# Patient Record
Sex: Male | Born: 1980 | Race: White | Hispanic: No | Smoking: Current every day smoker
Health system: Southern US, Community
[De-identification: ages and names within clinical notes are randomized; demographics above are authoritative.]

## PROBLEM LIST (undated history)

## (undated) ENCOUNTER — Emergency Department (HOSPITAL_COMMUNITY): Discharge status: Against Medical Advice | Payer: Self-pay

## (undated) DIAGNOSIS — M549 Dorsalgia, unspecified: Secondary | ICD-10-CM

## (undated) DIAGNOSIS — F1910 Other psychoactive substance abuse, uncomplicated: Secondary | ICD-10-CM

## (undated) DIAGNOSIS — R51 Headache: Secondary | ICD-10-CM

## (undated) DIAGNOSIS — K219 Gastro-esophageal reflux disease without esophagitis: Secondary | ICD-10-CM

## (undated) HISTORY — PX: BACK SURGERY: SHX140

## (undated) HISTORY — PX: KNEE ARTHROSCOPY: SUR90

---
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
## 2003-06-01 ENCOUNTER — Ambulatory Visit (HOSPITAL_COMMUNITY): Admission: RE | Admit: 2003-06-01 | Discharge: 2003-06-01 | Payer: Self-pay | Admitting: Internal Medicine

## 2003-12-01 ENCOUNTER — Emergency Department (HOSPITAL_COMMUNITY): Admission: EM | Admit: 2003-12-01 | Discharge: 2003-12-01 | Payer: Self-pay | Admitting: Emergency Medicine

## 2005-02-06 ENCOUNTER — Ambulatory Visit (HOSPITAL_COMMUNITY): Admission: RE | Admit: 2005-02-06 | Discharge: 2005-02-06 | Payer: Self-pay | Admitting: Internal Medicine

## 2005-04-20 ENCOUNTER — Ambulatory Visit (HOSPITAL_COMMUNITY): Admission: RE | Admit: 2005-04-20 | Discharge: 2005-04-20 | Payer: Self-pay | Admitting: Family Medicine

## 2005-07-02 ENCOUNTER — Emergency Department (HOSPITAL_COMMUNITY): Admission: EM | Admit: 2005-07-02 | Discharge: 2005-07-03 | Payer: Self-pay | Admitting: Emergency Medicine

## 2005-12-25 ENCOUNTER — Emergency Department (HOSPITAL_COMMUNITY): Admission: EM | Admit: 2005-12-25 | Discharge: 2005-12-25 | Payer: Self-pay | Admitting: Emergency Medicine

## 2006-05-03 ENCOUNTER — Emergency Department (HOSPITAL_COMMUNITY): Admission: EM | Admit: 2006-05-03 | Discharge: 2006-05-03 | Payer: Self-pay | Admitting: Emergency Medicine
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
## 2007-03-04 ENCOUNTER — Emergency Department (HOSPITAL_COMMUNITY): Admission: EM | Admit: 2007-03-04 | Discharge: 2007-03-04 | Payer: Self-pay | Admitting: Emergency Medicine

## 2007-08-05 ENCOUNTER — Emergency Department (HOSPITAL_COMMUNITY): Admission: EM | Admit: 2007-08-05 | Discharge: 2007-08-05 | Payer: Self-pay | Admitting: Emergency Medicine

## 2007-11-17 ENCOUNTER — Emergency Department (HOSPITAL_COMMUNITY): Admission: EM | Admit: 2007-11-17 | Discharge: 2007-11-17 | Payer: Self-pay | Admitting: Emergency Medicine

## 2008-01-19 ENCOUNTER — Emergency Department (HOSPITAL_COMMUNITY): Admission: EM | Admit: 2008-01-19 | Discharge: 2008-01-19 | Payer: Self-pay | Admitting: Emergency Medicine

## 2009-07-10 ENCOUNTER — Emergency Department (HOSPITAL_COMMUNITY): Admission: EM | Admit: 2009-07-10 | Discharge: 2009-07-10 | Payer: Self-pay | Admitting: Emergency Medicine

## 2009-07-24 ENCOUNTER — Emergency Department (HOSPITAL_COMMUNITY): Admission: EM | Admit: 2009-07-24 | Discharge: 2009-07-24 | Payer: Self-pay | Admitting: Emergency Medicine

## 2009-09-06 ENCOUNTER — Emergency Department (HOSPITAL_COMMUNITY)
Admission: EM | Admit: 2009-09-06 | Discharge: 2009-09-06 | Payer: Self-pay | Source: Home / Self Care | Admitting: Emergency Medicine

## 2009-10-14 ENCOUNTER — Emergency Department (HOSPITAL_COMMUNITY)
Admission: EM | Admit: 2009-10-14 | Discharge: 2009-10-14 | Payer: Self-pay | Source: Home / Self Care | Admitting: Emergency Medicine

## 2010-02-22 ENCOUNTER — Emergency Department (HOSPITAL_COMMUNITY)
Admission: EM | Admit: 2010-02-22 | Discharge: 2010-02-22 | Payer: Self-pay | Source: Home / Self Care | Admitting: Emergency Medicine

## 2010-03-05 ENCOUNTER — Emergency Department (HOSPITAL_COMMUNITY)
Admission: EM | Admit: 2010-03-05 | Discharge: 2010-03-05 | Payer: Self-pay | Source: Home / Self Care | Admitting: Emergency Medicine

## 2010-04-16 ENCOUNTER — Emergency Department (HOSPITAL_COMMUNITY)
Admission: EM | Admit: 2010-04-16 | Discharge: 2010-04-16 | Payer: Self-pay | Source: Home / Self Care | Admitting: Emergency Medicine

## 2010-04-26 ENCOUNTER — Emergency Department (HOSPITAL_COMMUNITY)
Admission: EM | Admit: 2010-04-26 | Discharge: 2010-04-26 | Payer: Self-pay | Source: Home / Self Care | Admitting: Emergency Medicine

## 2010-06-05 ENCOUNTER — Encounter: Payer: Self-pay | Admitting: Internal Medicine

## 2010-06-07 ENCOUNTER — Emergency Department (HOSPITAL_COMMUNITY)
Admission: EM | Admit: 2010-06-07 | Discharge: 2010-06-07 | Payer: Self-pay | Source: Home / Self Care | Admitting: Emergency Medicine

## 2010-07-10 ENCOUNTER — Emergency Department (HOSPITAL_COMMUNITY)
Admission: EM | Admit: 2010-07-10 | Discharge: 2010-07-10 | Disposition: A | Discharge status: Home / Self Care | Payer: PRIVATE HEALTH INSURANCE | Attending: Emergency Medicine | Admitting: Emergency Medicine

## 2010-07-10 DIAGNOSIS — M545 Low back pain, unspecified: Secondary | ICD-10-CM | POA: Insufficient documentation

## 2010-07-22 ENCOUNTER — Emergency Department (HOSPITAL_COMMUNITY)
Admission: EM | Admit: 2010-07-22 | Discharge: 2010-07-22 | Disposition: A | Discharge status: Home / Self Care | Payer: PRIVATE HEALTH INSURANCE | Attending: Emergency Medicine | Admitting: Emergency Medicine

## 2010-07-22 ENCOUNTER — Emergency Department (HOSPITAL_COMMUNITY): Payer: PRIVATE HEALTH INSURANCE

## 2010-07-22 DIAGNOSIS — M545 Low back pain, unspecified: Secondary | ICD-10-CM | POA: Insufficient documentation

## 2010-07-22 DIAGNOSIS — F17200 Nicotine dependence, unspecified, uncomplicated: Secondary | ICD-10-CM | POA: Insufficient documentation

## 2010-07-22 DIAGNOSIS — S335XXA Sprain of ligaments of lumbar spine, initial encounter: Secondary | ICD-10-CM | POA: Insufficient documentation

## 2010-07-22 DIAGNOSIS — X503XXA Overexertion from repetitive movements, initial encounter: Secondary | ICD-10-CM | POA: Insufficient documentation

## 2010-07-25 LAB — CBC
HCT: 47.4 % (ref 39.0–52.0)
MCH: 32.2 pg (ref 26.0–34.0)
MCV: 87.8 fL (ref 78.0–100.0)
Platelets: 224 10*3/uL (ref 150–400)
RBC: 5.4 MIL/uL (ref 4.22–5.81)
RDW: 12.2 % (ref 11.5–15.5)
WBC: 19.2 10*3/uL — ABNORMAL HIGH (ref 4.0–10.5)

## 2010-07-25 LAB — DIFFERENTIAL
Basophils Absolute: 0 10*3/uL (ref 0.0–0.1)
Basophils Relative: 0 % (ref 0–1)
Lymphs Abs: 0.3 10*3/uL — ABNORMAL LOW (ref 0.7–4.0)
Monocytes Absolute: 0.8 10*3/uL (ref 0.1–1.0)
Neutrophils Relative %: 94 % — ABNORMAL HIGH (ref 43–77)

## 2010-07-25 LAB — HEPATIC FUNCTION PANEL
AST: 25 U/L (ref 0–37)
Indirect Bilirubin: 1.4 mg/dL — ABNORMAL HIGH (ref 0.3–0.9)
Total Bilirubin: 1.8 mg/dL — ABNORMAL HIGH (ref 0.3–1.2)
Total Protein: 7.7 g/dL (ref 6.0–8.3)

## 2010-07-25 LAB — LIPASE, BLOOD: Lipase: 25 U/L (ref 11–59)

## 2010-07-25 LAB — BASIC METABOLIC PANEL
Chloride: 105 mEq/L (ref 96–112)
GFR calc Af Amer: >60 mL/min (ref >60)
GFR calc non Af Amer: >60 mL/min (ref >60)

## 2010-08-14 ENCOUNTER — Emergency Department (HOSPITAL_COMMUNITY)
Admission: EM | Admit: 2010-08-14 | Discharge: 2010-08-14 | Disposition: A | Discharge status: Home / Self Care | Payer: PRIVATE HEALTH INSURANCE | Attending: Emergency Medicine | Admitting: Emergency Medicine

## 2010-08-14 DIAGNOSIS — M545 Low back pain, unspecified: Secondary | ICD-10-CM | POA: Insufficient documentation

## 2010-08-14 DIAGNOSIS — G8929 Other chronic pain: Secondary | ICD-10-CM | POA: Insufficient documentation

## 2010-08-21 ENCOUNTER — Emergency Department (HOSPITAL_COMMUNITY)
Admission: EM | Admit: 2010-08-21 | Discharge: 2010-08-21 | Disposition: A | Discharge status: Home / Self Care | Payer: PRIVATE HEALTH INSURANCE | Attending: Emergency Medicine | Admitting: Emergency Medicine

## 2010-08-21 DIAGNOSIS — F17200 Nicotine dependence, unspecified, uncomplicated: Secondary | ICD-10-CM | POA: Insufficient documentation

## 2010-08-21 DIAGNOSIS — X58XXXA Exposure to other specified factors, initial encounter: Secondary | ICD-10-CM | POA: Insufficient documentation

## 2010-08-21 DIAGNOSIS — M545 Low back pain, unspecified: Secondary | ICD-10-CM | POA: Insufficient documentation

## 2010-08-21 DIAGNOSIS — S335XXA Sprain of ligaments of lumbar spine, initial encounter: Secondary | ICD-10-CM | POA: Insufficient documentation

## 2010-08-26 ENCOUNTER — Emergency Department (HOSPITAL_COMMUNITY)
Admission: EM | Admit: 2010-08-26 | Discharge: 2010-08-26 | Disposition: A | Discharge status: Home / Self Care | Payer: Medicaid Other | Attending: Emergency Medicine | Admitting: Emergency Medicine

## 2010-08-26 DIAGNOSIS — F17200 Nicotine dependence, unspecified, uncomplicated: Secondary | ICD-10-CM | POA: Insufficient documentation

## 2010-08-26 DIAGNOSIS — F411 Generalized anxiety disorder: Secondary | ICD-10-CM | POA: Insufficient documentation

## 2010-08-26 DIAGNOSIS — M545 Low back pain, unspecified: Secondary | ICD-10-CM | POA: Insufficient documentation

## 2011-02-12 ENCOUNTER — Encounter: Payer: Self-pay | Admitting: *Deleted

## 2011-02-12 ENCOUNTER — Emergency Department (HOSPITAL_COMMUNITY)
Admission: EM | Admit: 2011-02-12 | Discharge: 2011-02-12 | Disposition: A | Discharge status: Home / Self Care | Payer: Medicaid Other | Attending: Emergency Medicine | Admitting: Emergency Medicine

## 2011-02-12 DIAGNOSIS — F17200 Nicotine dependence, unspecified, uncomplicated: Secondary | ICD-10-CM | POA: Insufficient documentation

## 2011-02-12 DIAGNOSIS — M5430 Sciatica, unspecified side: Secondary | ICD-10-CM | POA: Insufficient documentation

## 2011-02-12 HISTORY — DX: Dorsalgia, unspecified: M54.9

## 2011-02-12 MED ORDER — HYDROMORPHONE HCL 2 MG/ML IJ SOLN
2.0000 mg | Freq: Once | INTRAMUSCULAR | Status: AC
  Administered 2011-02-12: 2 mg via INTRAMUSCULAR
  Filled 2011-02-12: qty 1

## 2011-02-12 MED ORDER — ONDANSETRON HCL 4 MG/2ML IJ SOLN
4.0000 mg | Freq: Once | INTRAMUSCULAR | Status: AC
  Administered 2011-02-12: 4 mg via INTRAMUSCULAR
  Filled 2011-02-12: qty 2

## 2011-02-12 MED ORDER — OXYCODONE-ACETAMINOPHEN 5-325 MG PO TABS: 1.0000 | ORAL_TABLET | ORAL | Status: AC | PRN

## 2011-02-12 MED ORDER — DIAZEPAM 5 MG PO TABS: 5.0000 mg | ORAL_TABLET | Freq: Four times a day (QID) | ORAL | Status: AC

## 2011-02-12 NOTE — ED Notes (Signed)
Pt reports chronic pain in legs and arms d/t buldging disc, pt reports pain meds are not working

## 2011-02-25 NOTE — ED Provider Notes (Cosign Needed)
History     CSN: [id number] Arrival date & time: 02/12/2011  8:28 PM  Chief Complaint  Patient presents with  . Muscle Pain    (Consider location/radiation/quality/duration/timing/severity/associated sxs/prior treatment) Patient is a 30 y.o. male presenting with back pain. The history is provided by the patient and medical records.  Back Pain  This is a chronic problem. The current episode started more than 1 week ago. The problem occurs constantly. The problem has been gradually worsening. Associated with: He has chronic low back pain, for which he has been seen on a number of occasions in the [facility] ED. The pain is present in the lumbar spine. Quality: It is a sharp pain going to his legs. The pain is at a severity of 10/10. The pain is severe. The symptoms are aggravated by bending and twisting. He has tried analgesics and muscle relaxants for the symptoms. The treatment provided no relief.   Pt reports chronic pain in legs and arms d/t buldging disc, pt reports pain meds are not working  Past Medical History  Diagnosis Date  . Back pain     Past Surgical History  Procedure Date  . Knee arthroscopy     No family history on file.  History  Substance Use Topics  . Smoking status: Current Everyday Smoker  . Smokeless tobacco: Not on file  . Alcohol Use: No      Review of Systems  Musculoskeletal: Positive for back pain.  All other systems reviewed and are negative.    Allergies  Erythrocin; Penicillins; and Tramadol  Home Medications   Current Outpatient Rx  Name Route Sig Dispense Refill  . VALIUM PO Oral Take by mouth.      Marland Kitchen HYDROCODONE-ACETAMINOPHEN 5-325 MG PO TABS Oral Take 1 tablet by mouth every 6 (six) hours as needed.        BP 115/84  Pulse 69  Temp(Src) 98 F (36.7 C) (Oral)  Resp 18  Ht 6\' 5"  (1.956 m)  Wt 200 lb (90.719 kg)  BMI 23.72 kg/m2  SpO2 100%  Physical Exam  Constitutional: He is oriented to person, place, and time. He  appears well-developed and well-nourished.       In moderate distress with back pain.  HENT:  Head: Normocephalic and atraumatic.  Right Ear: External ear normal.  Left Ear: External ear normal.  Mouth/Throat: Oropharynx is clear and moist.  Eyes: Conjunctivae and EOM are normal. Pupils are equal, round, and reactive to light.  Neck: Normal range of motion. Neck supple.  Cardiovascular: Normal rate, regular rhythm and normal heart sounds.   Pulmonary/Chest: Effort normal and breath sounds normal.  Abdominal: Soft. Bowel sounds are normal. He exhibits no distension. There is no tenderness.  Musculoskeletal:       He localizes pain to the lumbar region and into the legs bilaterally.  There is no palpable deformity or point tenderness of the lumbar region.  Lymphadenopathy:    He has no cervical adenopathy.  Neurological: He is alert and oriented to person, place, and time.       No sensory or motor deficits.  Skin: Skin is warm and dry.  Psychiatric: He has a normal mood and affect. His behavior is normal.    ED Course  Procedures (including critical care time) Pt given injection of dilaudid and zofran for pain relief. Labs Reviewed - No data to display No results found. Pt has recurrent bouts of low back pain.  There was no history of injury  so no imaging was required.  He was prescribed Percocet and Valium for his pain and muscle spasm.  F/U with Dr. Jordan Likes, whom he has seen in the past for this problem.  1. Sciatica      MDM  i don't believe i saw this pt.  No midlevel provider was involved in this pt's management. Osvaldo Human, M.D.       Worthy Rancher, PA 02/28/11 1702  Carleene Cooper III, MD 03/05/11 [phone]

## 2011-03-10 ENCOUNTER — Emergency Department (HOSPITAL_COMMUNITY)
Admission: EM | Admit: 2011-03-10 | Discharge: 2011-03-10 | Disposition: A | Discharge status: Home / Self Care | Payer: Medicaid Other | Attending: Emergency Medicine | Admitting: Emergency Medicine

## 2011-03-10 ENCOUNTER — Encounter (HOSPITAL_COMMUNITY): Payer: Self-pay | Admitting: *Deleted

## 2011-03-10 DIAGNOSIS — G8929 Other chronic pain: Secondary | ICD-10-CM | POA: Insufficient documentation

## 2011-03-10 DIAGNOSIS — M549 Dorsalgia, unspecified: Secondary | ICD-10-CM | POA: Insufficient documentation

## 2011-03-10 MED ORDER — DIAZEPAM 5 MG PO TABS
5.0000 mg | ORAL_TABLET | Freq: Once | ORAL | Status: AC
  Administered 2011-03-10: 5 mg via ORAL

## 2011-03-10 MED ORDER — DIAZEPAM 5 MG PO TABS
ORAL_TABLET | ORAL | Status: AC
  Administered 2011-03-10: 5 mg via ORAL
  Filled 2011-03-10: qty 1

## 2011-03-10 MED ORDER — HYDROMORPHONE HCL 2 MG/ML IJ SOLN
2.0000 mg | Freq: Once | INTRAMUSCULAR | Status: AC
  Administered 2011-03-10: 13:00:00 via INTRAMUSCULAR

## 2011-03-10 MED ORDER — HYDROMORPHONE HCL 2 MG/ML IJ SOLN
INTRAMUSCULAR | Status: AC
  Filled 2011-03-10: qty 1

## 2011-03-10 NOTE — ED Notes (Signed)
Chronic back pain x 8 months.  States it never stops hurting and he needs some relief from the pain until he can see his neurologist until 03/17/11.

## 2011-03-15 ENCOUNTER — Encounter (HOSPITAL_COMMUNITY): Payer: Self-pay | Admitting: *Deleted

## 2011-03-15 ENCOUNTER — Emergency Department (HOSPITAL_COMMUNITY)
Admission: EM | Admit: 2011-03-15 | Discharge: 2011-03-15 | Disposition: A | Discharge status: Home / Self Care | Payer: Self-pay | Attending: Emergency Medicine | Admitting: Emergency Medicine

## 2011-03-15 DIAGNOSIS — G8929 Other chronic pain: Secondary | ICD-10-CM | POA: Insufficient documentation

## 2011-03-15 DIAGNOSIS — M545 Low back pain, unspecified: Secondary | ICD-10-CM | POA: Insufficient documentation

## 2011-03-15 DIAGNOSIS — F17200 Nicotine dependence, unspecified, uncomplicated: Secondary | ICD-10-CM | POA: Insufficient documentation

## 2011-03-15 MED ORDER — ONDANSETRON 4 MG PO TBDP
4.0000 mg | ORAL_TABLET | Freq: Once | ORAL | Status: AC
  Administered 2011-03-15: 4 mg via ORAL
  Filled 2011-03-15: qty 1

## 2011-03-15 MED ORDER — OXYCODONE-ACETAMINOPHEN 5-325 MG PO TABS: 2.0000 | ORAL_TABLET | ORAL | Status: AC | PRN

## 2011-03-15 MED ORDER — HYDROMORPHONE HCL 2 MG/ML IJ SOLN
2.0000 mg | Freq: Once | INTRAMUSCULAR | Status: AC
  Administered 2011-03-15: 2 mg via INTRAMUSCULAR
  Filled 2011-03-15: qty 1

## 2011-03-15 NOTE — ED Notes (Signed)
Pt ride in room with him,

## 2011-03-15 NOTE — ED Provider Notes (Signed)
History     CSN: [id number] Arrival date & time: 03/15/2011  9:06 PM   First MD Initiated Contact with Patient 03/15/11 2113      Chief Complaint  Patient presents with  . Back Pain    (Consider location/radiation/quality/duration/timing/severity/associated sxs/prior treatment) HPI Comments: Pt scheduled to see dr. Dutch Quint in 2 days.  Says that he intends to increase the strength of his narcotic pain medicine.  Patient is a 30 y.o. male presenting with back pain. The history is provided by the patient. No language interpreter was used.  Back Pain  This is a chronic problem. The problem has not changed since onset.The pain is present in the lumbar spine. The symptoms are aggravated by bending and twisting. He has tried analgesics for the symptoms. The treatment provided mild relief.    Past Medical History  Diagnosis Date  . Back pain     Past Surgical History  Procedure Date  . Knee arthroscopy     History reviewed. No pertinent family history.  History  Substance Use Topics  . Smoking status: Current Everyday Smoker -- 1.0 packs/day  . Smokeless tobacco: Not on file  . Alcohol Use: No      Review of Systems  Musculoskeletal: Positive for back pain.  All other systems reviewed and are negative.    Allergies  Erythrocin; Penicillins; and Tramadol  Home Medications   Current Outpatient Rx  Name Route Sig Dispense Refill  . ACETAMINOPHEN ER 650 MG PO TBCR Oral Take 650 mg by mouth every 8 (eight) hours as needed. pain     . VALIUM PO Oral Take by mouth.     Marland Kitchen HYDROCODONE-ACETAMINOPHEN 5-325 MG PO TABS Oral Take 1 tablet by mouth every 6 (six) hours as needed.     . MORPHINE SULFATE ER 30 MG PO TB12 Oral Take 30 mg by mouth 3 (three) times daily.       BP 123/66  Pulse 71  Resp 19  Ht 6\' 5"  (1.956 m)  Wt 200 lb (90.719 kg)  BMI 23.72 kg/m2  SpO2 100%  Physical Exam  Nursing note and vitals reviewed. Constitutional: He is oriented to person, place, and  time. He appears well-developed and well-nourished.  HENT:  Head: Normocephalic and atraumatic.  Eyes: EOM are normal.  Neck: Normal range of motion.  Cardiovascular: Normal rate, regular rhythm, normal heart sounds and intact distal pulses.   Pulmonary/Chest: Effort normal and breath sounds normal. No respiratory distress.  Abdominal: Soft. He exhibits no distension. There is no tenderness.  Musculoskeletal:       Right shoulder: He exhibits decreased range of motion and tenderness. He exhibits no bony tenderness.       Arms: Neurological: He is alert and oriented to person, place, and time.  Skin: Skin is warm and dry.  Psychiatric: He has a normal mood and affect. Judgment normal.    ED Course  Procedures (including critical care time)  Labs Reviewed - No data to display No results found.   No diagnosis found.    MDM          Worthy Rancher, PA 03/15/11 2219  Worthy Rancher, PA 03/15/11 2227  Worthy Rancher, PA 03/15/11 2228  Worthy Rancher, PA 03/15/11 2228  Worthy Rancher, PA 03/15/11 [phone]

## 2011-03-15 NOTE — ED Notes (Signed)
Pt here for pain control due to chronic back problems, unable to see neurologist until Friday. Pt ambulatory to tx room,

## 2011-03-15 NOTE — ED Notes (Signed)
Pt reports increased back pain.  Reports he has surgery scheduled for January.  Reports appointment with neurologist on Friday for pain medication increase.  States he was instructed to come to ED in the mean time for pain medication.
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
## 2011-03-15 NOTE — ED Notes (Signed)
Pt waiting for ride to be visible in er before pain medication to be given

## 2011-03-16 NOTE — ED Provider Notes (Signed)
Medical screening examination/treatment/procedure(s) were performed by non-physician practitioner and as supervising physician I was immediately available for consultation/collaboration.  Donnetta Hutching, MD 03/16/11 [phone]

## 2011-04-01 ENCOUNTER — Encounter (HOSPITAL_COMMUNITY): Payer: Self-pay

## 2011-04-01 ENCOUNTER — Emergency Department (HOSPITAL_COMMUNITY)
Admission: EM | Admit: 2011-04-01 | Discharge: 2011-04-01 | Disposition: A | Discharge status: Home / Self Care | Payer: Medicaid Other | Attending: Emergency Medicine | Admitting: Emergency Medicine

## 2011-04-01 DIAGNOSIS — R209 Unspecified disturbances of skin sensation: Secondary | ICD-10-CM | POA: Insufficient documentation

## 2011-04-01 DIAGNOSIS — M549 Dorsalgia, unspecified: Secondary | ICD-10-CM

## 2011-04-01 DIAGNOSIS — IMO0002 Reserved for concepts with insufficient information to code with codable children: Secondary | ICD-10-CM | POA: Insufficient documentation

## 2011-04-01 DIAGNOSIS — M545 Low back pain, unspecified: Secondary | ICD-10-CM | POA: Insufficient documentation

## 2011-04-01 DIAGNOSIS — F17200 Nicotine dependence, unspecified, uncomplicated: Secondary | ICD-10-CM | POA: Insufficient documentation

## 2011-04-01 DIAGNOSIS — R29898 Other symptoms and signs involving the musculoskeletal system: Secondary | ICD-10-CM | POA: Insufficient documentation

## 2011-04-01 MED ORDER — MORPHINE SULFATE CR 30 MG PO TB12: 30.0000 mg | ORAL_TABLET | Freq: Two times a day (BID) | ORAL | Status: AC

## 2011-04-01 MED ORDER — HYDROMORPHONE HCL PF 2 MG/ML IJ SOLN
2.0000 mg | Freq: Once | INTRAMUSCULAR | Status: AC
  Administered 2011-04-01: 2 mg via INTRAMUSCULAR
  Filled 2011-04-01: qty 1

## 2011-04-01 MED ORDER — HYDROMORPHONE HCL 4 MG PO TABS: 4.0000 mg | ORAL_TABLET | ORAL | Status: AC | PRN

## 2011-04-01 NOTE — ED Provider Notes (Signed)
History  Scribed for Donnetta Hutching, MD, the patient was seen in room APA07/APA07. This chart was scribed by Ellie Lunch.   CSN: [id number] Arrival date & time: 04/01/2011  6:45 PM   First MD Initiated Contact with Patient 04/01/11 1900      Chief Complaint  Patient presents with  . Back Pain    HPI Ryan Valentine is a 30 y.o. male who presents to the Emergency Department complaining of constant, diffuse back pain described as soreness that radiates down his left leg with associated numbness in his left arm and weakness in his left leg. Pt has h/o chronic back pain that began in January 2011 after he injured himself at work. Pt is currently seeing Dr. Jordan Likes (Neuro) for herniated disks and has been prescribed hydrocodone, valium and MS contin for pain until his surgery on January 21st. Pt claims that there was a communication problem between Dr. Lindalou Hose office and his pharmacy and that his prescriptions for hydrocodone and MS contin were not filled. Pt is asking for pain medication for the upcoming holidays until he can contact Dr. Lindalou Hose office to straight things out.      Past Medical History  Diagnosis Date  . Back pain     Past Surgical History  Procedure Date  . Knee arthroscopy     No family history on file.  History  Substance Use Topics  . Smoking status: Current Everyday Smoker -- 1.0 packs/day  . Smokeless tobacco: Not on file  . Alcohol Use: No      Review of Systems 10 Systems reviewed and are negative for acute change except as noted in the HPI.  Allergies  Erythrocin; Penicillins; and Tramadol  Home Medications   Current Outpatient Rx  Name Route Sig Dispense Refill  . ACETAMINOPHEN ER 650 MG PO TBCR Oral Take 650 mg by mouth every 8 (eight) hours as needed. pain     . VALIUM PO Oral Take by mouth.     Marland Kitchen HYDROCODONE-ACETAMINOPHEN 5-325 MG PO TABS Oral Take 1 tablet by mouth every 6 (six) hours as needed.     . MORPHINE SULFATE ER 30 MG PO TB12 Oral Take  30 mg by mouth 3 (three) times daily.       BP 124/74  Pulse 68  Temp(Src) 97.5 F (36.4 C) (Oral)  Resp 20  Ht 6\' 5"  (1.956 m)  Wt 200 lb (90.719 kg)  BMI 23.72 kg/m2  SpO2 100%  Physical Exam  Nursing note and vitals reviewed. Constitutional: He is oriented to person, place, and time. He appears well-developed and well-nourished.  HENT:  Head: Normocephalic and atraumatic.  Eyes: Conjunctivae and EOM are normal. Pupils are equal, round, and reactive to light.  Neck: Normal range of motion. Neck supple.  Cardiovascular: Normal rate and regular rhythm.   Pulmonary/Chest: Effort normal and breath sounds normal.  Abdominal: Soft.  Musculoskeletal: Normal range of motion. He exhibits tenderness.       Straight leg raise tenderness felt in both legs-worst in the left  Neurological: He is alert and oriented to person, place, and time.  Skin: Skin is warm and dry.  Psychiatric: He has a normal mood and affect.    ED Course  Procedures (including critical care time)  DIAGNOSTIC STUDIES: Oxygen Saturation is 100% on room air, normal by my interpretation.    COORDINATION OF CARE: 7:23PM-Discussed giving pt shot of Dilaudid and one weeks worth of  pain medications to last Pt until he can  get in contact with Dr. Jordan Likes.    ED MEDICATIONS  Medications  HYDROmorphone (DILAUDID) injection 2 mg (2 mg Intramuscular Given 04/01/11 1937)    ED DISCHARGE MEDICATIONS New Prescriptions   HYDROMORPHONE (DILAUDID) 4 MG TABLET    Take 1 tablet (4 mg total) by mouth every 4 (four) hours as needed for pain.   MORPHINE (MS CONTIN) 30 MG 12 HR TABLET    Take 1 tablet (30 mg total) by mouth 2 (two) times daily.      1. Back pain       MDM  Patient has long history of low back pain. Schedule for neurosurgery in January 21 after insurance kicks in.  No bowel or bladder incontinence. He has run out of his pain medications. Anticipates seeing neurosurgeon next week   I personally performed  the services described in this documentation, which was scribed in my presence. The recorded information has been reviewed and considered.        Donnetta Hutching, MD 04/01/11 2011

## 2011-04-01 NOTE — ED Notes (Signed)
Pt states has no relief from pain medication.

## 2011-04-01 NOTE — ED Notes (Signed)
Pt reports history of back pain.  Says is supposed to have surgery January 21st.  Says pain is in whole back and radiates down left leg.

## 2011-04-01 NOTE — ED Notes (Signed)
Pt complain of chronic lower back pain. States is having lower back pain, has 2 ruptured disc, bone fragments in spinal column and has no pain medication, fears pain medication from PCP won't be available since there was a "mix up last week".  Pt goes on to say has no insurance and pcp wouldn't give pain meds secondary to no $ to see MD.  Pt asked to see MD, not PA-C.

## 2011-04-03 ENCOUNTER — Emergency Department (HOSPITAL_COMMUNITY)
Admission: EM | Admit: 2011-04-03 | Discharge: 2011-04-03 | Disposition: A | Discharge status: Home / Self Care | Payer: Medicaid Other | Attending: Emergency Medicine | Admitting: Emergency Medicine

## 2011-04-03 ENCOUNTER — Encounter (HOSPITAL_COMMUNITY): Payer: Self-pay | Admitting: *Deleted

## 2011-04-03 ENCOUNTER — Observation Stay (HOSPITAL_COMMUNITY)
Admission: EM | Admit: 2011-04-03 | Discharge: 2011-04-04 | Disposition: A | Discharge status: Home / Self Care | Payer: Medicaid Other | Attending: Emergency Medicine | Admitting: Emergency Medicine

## 2011-04-03 DIAGNOSIS — M545 Low back pain, unspecified: Secondary | ICD-10-CM | POA: Insufficient documentation

## 2011-04-03 DIAGNOSIS — M519 Unspecified thoracic, thoracolumbar and lumbosacral intervertebral disc disorder: Secondary | ICD-10-CM

## 2011-04-03 DIAGNOSIS — M79609 Pain in unspecified limb: Secondary | ICD-10-CM | POA: Insufficient documentation

## 2011-04-03 DIAGNOSIS — IMO0002 Reserved for concepts with insufficient information to code with codable children: Secondary | ICD-10-CM

## 2011-04-03 DIAGNOSIS — R209 Unspecified disturbances of skin sensation: Secondary | ICD-10-CM | POA: Insufficient documentation

## 2011-04-03 DIAGNOSIS — G8929 Other chronic pain: Secondary | ICD-10-CM | POA: Insufficient documentation

## 2011-04-03 DIAGNOSIS — M47817 Spondylosis without myelopathy or radiculopathy, lumbosacral region: Principal | ICD-10-CM | POA: Insufficient documentation

## 2011-04-03 DIAGNOSIS — F17200 Nicotine dependence, unspecified, uncomplicated: Secondary | ICD-10-CM | POA: Insufficient documentation

## 2011-04-03 MED ORDER — ONDANSETRON HCL 4 MG PO TABS
4.0000 mg | ORAL_TABLET | Freq: Once | ORAL | Status: AC
  Administered 2011-04-03: 4 mg via ORAL
  Filled 2011-04-03: qty 1

## 2011-04-03 MED ORDER — HYDROCODONE-ACETAMINOPHEN 5-325 MG PO TABS: 1.0000 | ORAL_TABLET | ORAL | Status: DC | PRN

## 2011-04-03 MED ORDER — HYDROMORPHONE HCL PF 1 MG/ML IJ SOLN
2.0000 mg | Freq: Once | INTRAMUSCULAR | Status: AC
  Administered 2011-04-03: 2 mg via INTRAMUSCULAR
  Filled 2011-04-03: qty 2

## 2011-04-03 NOTE — ED Notes (Signed)
For the past 2 days he has had some back pain and some lt sided numbness.  He has been seen x2 in the past 2 days for the same.

## 2011-04-03 NOTE — ED Provider Notes (Signed)
History     CSN: [id number] Arrival date & time: 04/03/2011  8:34 PM   First MD Initiated Contact with Patient 04/03/11 2337      Chief Complaint  Patient presents with  . Back Pain    (Consider location/radiation/quality/duration/timing/severity/associated sxs/prior treatment) HPI This is a 30 year old white male with a history of chronic low back pain. He is scheduled for surgery February 20 of next year. His back pain has acutely changed over the past 4 days. Whereas he has had chronic pain in the lower back in the past 4 days not only has the pain become severe he is also having paresthesias and weakness in the left leg. He has been seen in the [facility] emergency department twice in the last 2 days. He has been given prescriptions for Dilaudid and hydrocodone without relief. He states he only gets relief from parenteral narcotics but the tablets are not giving him any relief. He states he is not even taking the tablets because they're ineffective. He denies saddle anesthesia, urinary or fecal retention or incontinence. The pain is worsened by movements of the lower extremities at the hips. Range of motion there is decreased.  Past Medical History  Diagnosis Date  . Back pain     Past Surgical History  Procedure Date  . Knee arthroscopy     No family history on file.  History  Substance Use Topics  . Smoking status: Current Everyday Smoker -- 1.0 packs/day  . Smokeless tobacco: Not on file  . Alcohol Use: No      Review of Systems  All other systems reviewed and are negative.    Allergies  Penicillins; Tramadol; and Erythrocin  Home Medications   Current Outpatient Rx  Name Route Sig Dispense Refill  . VALIUM PO Oral Take 5 mg by mouth at bedtime as needed. For sleep    . HYDROCODONE-ACETAMINOPHEN 5-325 MG PO TABS Oral Take 1 tablet by mouth every 4 (four) hours as needed for pain. 15 tablet 0  . HYDROMORPHONE HCL 4 MG PO TABS Oral Take 1 tablet (4 mg total)  by mouth every 4 (four) hours as needed for pain. 20 tablet 0  . MORPHINE SULFATE ER 30 MG PO TB12 Oral Take 1 tablet (30 mg total) by mouth 2 (two) times daily. 20 tablet 0  . ACETAMINOPHEN ER 650 MG PO TBCR Oral Take 650 mg by mouth every 8 (eight) hours as needed. pain       BP 91/60  Pulse 50  Temp(Src) 97.9 F (36.6 C) (Oral)  Resp 18  SpO2 99%  Physical Exam General: Well-developed, well-nourished male in no acute distress; appearance consistent with age of record HENT: normocephalic, atraumatic Eyes: Normal appearance Neck: supple Heart: regular rate and rhythm Lungs: Normal respiratory effort and excursion Abdomen: soft; nondistended Back: Lumbar paralumbar tenderness bilaterally GU: No saddle anesthesia Extremities: No deformity; pulses normal; decreased range of motion at hips Neurologic: Awake, alert and oriented; normal strength in upper extremities; strength was 4/5 left lower extremity, +5 out of 5 in the right lower extremity but exam limited due to pain; decreased sensation left lower extremity Skin: Warm and dry Psychiatric: Normal mood and affect    ED Course  Procedures (including critical care time)     MDM  Will place in CDU her back pain protocol with plans for MRI in the morning. His last MRI was about 3 months ago.  Medical screening examination/treatment/procedure(s) were conducted as a shared visit with non-physician  practitioner(s) and myself.  I personally evaluated the patient during the encounter  The nonphysician practitioner, Trixie Dredge, PA-C, will be responsible for CDU followup. Initial H&P and care were performed by myself.  7:11 AM Awaiting results of MRI. Disposition and further care per [facility] as noted above.          Hanley Seamen, MD 04/04/11 [phone]

## 2011-04-03 NOTE — ED Notes (Signed)
Pt states that he is suppose to have back surgery in Jan for slipped disks. Pt states that his left leg has been really painful and he is having difficulty walking it feels paralyzed. Pt states that he was seen at [facility] and given dilaudid and then percocet and the dilaudid helped but the percocet did not. Pt states that he came here to have it on record so when he calls his surgeon (MD Dutch Quint) in the morning he can see if he can move his surgery up from Jan. Pt states that he started having pain in his left arm as well. Pt alert and oriented and able to follow commands and move extremities.

## 2011-04-03 NOTE — ED Provider Notes (Signed)
History     CSN: [id number] Arrival date & time: 04/03/2011  1:38 AM   First MD Initiated Contact with Patient 04/03/11 0253      No chief complaint on file.   (Consider location/radiation/quality/duration/timing/severity/associated sxs/prior treatment) HPI Comments: Ryan Valentine is a 30 y.o. male who presents to the Emergency Department complaining of continuous  back pain, left leg pain, and numbness in both his left leg and arm for several days that is not responding to narcotic analgesics. He has a h/o chronic back pain due to herniated discs from an injury in 2011. He sees Dr. Jordan Likes, neurosurgeon and is scheduled for surgery 06/05/2011. Dr. Jordan Likes has prescribed valium, hydrocodone and MS contin.Patient has been unable to get his medicines. He was seen in the ER here yesterday and given IM diluaudid and a prescription for dilaudid. Patient states medicine is not helping his pain.  Patient is a 30 y.o. male presenting with back pain. The history is provided by the patient.  Back Pain  This is a chronic problem. The current episode started more than 2 days ago. The problem occurs constantly. The problem has been gradually worsening. The pain is present in the lumbar spine. The quality of the pain is described as aching and burning. Radiates to: left sided pain and numbness. The pain is at a severity of 10/10. The pain is severe. The symptoms are aggravated by bending, twisting and certain positions. The pain is the same all the time. Associated symptoms include leg pain and paresthesias. Treatments tried: narcotic. The treatment provided no relief.    Past Medical History  Diagnosis Date  . Back pain     Past Surgical History  Procedure Date  . Knee arthroscopy     History reviewed. No pertinent family history.  History  Substance Use Topics  . Smoking status: Current Everyday Smoker -- 1.0 packs/day  . Smokeless tobacco: Not on file  . Alcohol Use: No      Review of  Systems  Musculoskeletal: Positive for back pain.  Neurological: Positive for paresthesias.  All other systems reviewed and are negative.    Allergies  Erythrocin; Penicillins; and Tramadol  Home Medications   Current Outpatient Rx  Name Route Sig Dispense Refill  . ACETAMINOPHEN ER 650 MG PO TBCR Oral Take 650 mg by mouth every 8 (eight) hours as needed. pain     . VALIUM PO Oral Take by mouth.     Marland Kitchen HYDROCODONE-ACETAMINOPHEN 5-325 MG PO TABS Oral Take 1 tablet by mouth every 6 (six) hours as needed.     Marland Kitchen HYDROMORPHONE HCL 4 MG PO TABS Oral Take 1 tablet (4 mg total) by mouth every 4 (four) hours as needed for pain. 20 tablet 0  . MORPHINE SULFATE ER 30 MG PO TB12 Oral Take 30 mg by mouth 3 (three) times daily.     . MORPHINE SULFATE ER 30 MG PO TB12 Oral Take 1 tablet (30 mg total) by mouth 2 (two) times daily. 20 tablet 0    BP 112/58  Pulse 64  Resp 20  Ht 6\' 5"  (1.956 m)  Wt 200 lb (90.719 kg)  BMI 23.72 kg/m2  SpO2 98%  Physical Exam  Nursing note and vitals reviewed. Constitutional: He is oriented to person, place, and time. He appears well-developed and well-nourished.  HENT:  Head: Normocephalic and atraumatic.  Mouth/Throat: Oropharynx is clear and moist.  Eyes: EOM are normal.  Neck: Normal range of motion.  Cardiovascular: Normal rate,  normal heart sounds and intact distal pulses.   Pulmonary/Chest: Breath sounds normal.  Abdominal: Soft.  Musculoskeletal:       No spinal tenderness to percussion. Tenderness to lumbar paraspinal muscles to palpation bilaterally. Positive strait leg raise bilaterally. No appreciable difference in strength between left leg and right leg. Patient is right handed and has slight increased strength in the right hand over the left hand.  Neurological: He is alert and oriented to person, place, and time. He has normal reflexes.  Skin: Skin is warm and dry.    ED Course  Procedures (including critical care time)  Labs Reviewed -  No data to display No results found.   No diagnosis found.    MDM  Patient with h/o chronic back pain, scheduled for surgery 05/2011 under the care of Dr. Jordan Likes, neurosurgeon. Not getting relief from dilaudid PO. Asked for Rx for hydrocodone pending his follow up with Dr. Jordan Likes. Given IM dilaudid and Rx for hydrocodone.The patient appears reasonably screened and/or stabilized for discharge and I doubt any other medical condition or other [facility] requiring further screening, evaluation, or treatment in the ED at this time prior to discharge.  MDM Reviewed: nursing note and vitals           Nicoletta Dress. Colon Branch, MD 04/03/11 [phone]

## 2011-04-03 NOTE — ED Notes (Signed)
Pt c/o left sided numbness due to chronic back pain. Pt was given dilaudid yesterday and states it isn't relieving his pain.

## 2011-04-04 ENCOUNTER — Observation Stay (HOSPITAL_COMMUNITY): Payer: Medicaid Other

## 2011-04-04 ENCOUNTER — Emergency Department (HOSPITAL_COMMUNITY): Payer: Medicaid Other

## 2011-04-04 MED ORDER — ONDANSETRON HCL 4 MG/2ML IJ SOLN
4.0000 mg | Freq: Four times a day (QID) | INTRAMUSCULAR | Status: DC | PRN
  Administered 2011-04-04: 4 mg via INTRAVENOUS
  Filled 2011-04-04: qty 2

## 2011-04-04 MED ORDER — METHOCARBAMOL 100 MG/ML IJ SOLN
1000.0000 mg | Freq: Once | INTRAVENOUS | Status: AC
  Administered 2011-04-04: 1000 mg via INTRAVENOUS
  Filled 2011-04-04: qty 10

## 2011-04-04 MED ORDER — HYDROMORPHONE HCL PF 1 MG/ML IJ SOLN
1.0000 mg | Freq: Once | INTRAMUSCULAR | Status: AC
  Administered 2011-04-04: 1 mg via INTRAVENOUS
  Filled 2011-04-04: qty 1

## 2011-04-04 MED ORDER — METHYLPREDNISOLONE ACETATE 80 MG/ML IJ SUSP
80.0000 mg | Freq: Once | INTRAMUSCULAR | Status: AC
  Administered 2011-04-04: 80 mg via INTRALESIONAL

## 2011-04-04 MED ORDER — HYDROMORPHONE HCL PF 1 MG/ML IJ SOLN
1.0000 mg | Freq: Once | INTRAMUSCULAR | Status: DC
  Filled 2011-04-04: qty 1

## 2011-04-04 MED ORDER — KETOROLAC TROMETHAMINE 30 MG/ML IJ SOLN
INTRAMUSCULAR | Status: AC
  Filled 2011-04-04: qty 1

## 2011-04-04 MED ORDER — HYDROMORPHONE HCL 2 MG PO TABS
2.0000 mg | ORAL_TABLET | ORAL | Status: DC | PRN
  Administered 2011-04-04 (×2): 2 mg via ORAL
  Filled 2011-04-04 (×2): qty 1

## 2011-04-04 MED ORDER — LORAZEPAM 2 MG/ML IJ SOLN
2.0000 mg | Freq: Once | INTRAMUSCULAR | Status: AC
  Administered 2011-04-04: 2 mg via INTRAVENOUS
  Filled 2011-04-04: qty 1

## 2011-04-04 MED ORDER — METHYLPREDNISOLONE ACETATE 40 MG/ML IJ SUSP
40.0000 mg | Freq: Once | INTRAMUSCULAR | Status: AC
  Administered 2011-04-04: 40 mg via INTRALESIONAL

## 2011-04-04 MED ORDER — HYDROCODONE-ACETAMINOPHEN 5-325 MG PO TABS: 1.0000 | ORAL_TABLET | ORAL | Status: AC | PRN

## 2011-04-04 MED ORDER — KETOROLAC TROMETHAMINE 30 MG/ML IJ SOLN
30.0000 mg | Freq: Once | INTRAMUSCULAR | Status: AC
  Administered 2011-04-04: 30 mg via INTRAVENOUS

## 2011-04-04 MED ORDER — METHOCARBAMOL 100 MG/ML IJ SOLN
1000.0000 mg | Freq: Once | INTRAMUSCULAR | Status: DC
  Filled 2011-04-04: qty 10

## 2011-04-04 NOTE — ED Provider Notes (Signed)
Patient has completed his epidural injection by interventional radiology. Patient to be discharged home with follow-up by Dr. Danielle Dess.  Jimmye Norman, NP 04/04/11 [phone]

## 2011-04-04 NOTE — ED Notes (Signed)
Dr Danielle Dess here to see pt

## 2011-04-04 NOTE — ED Provider Notes (Signed)
7:37 AM Patient is in CDU under back pain protocol with MRI planned for this morning.  Plan is for IV medications, MRI, call to Dr Dutch Quint regarding further management.  Patient reports he has had no relief with PO medications, reports continued pain in low back and numbness in left leg.  On exam, pt is A&Ox4, NAD, RRR, CTAB, T and L spine diffusely tender, paraspinal tenderness as well, no crepitus, step-offs, skin changes.  Left lower extremity 4/5 strength at hip, 5/5 at knee.  Sensation decreased on left.  Reflexes intact.  Distal pulses intact and equal bilaterally.  Have ordered IV analgesics and canceled PO, per my discussion with Dr Read Drivers.  Will continue to follow.    11:15 AM Received call from Dr Danielle Dess who has looked at patient's MRI.  Will see patient in CDU.    Dr Danielle Dess has ordered IR epidural steroid injection for patient.  Patient remains on back pain protocol.  3:42 PM Discussed patient with Felicie Morn, NP, who assumes care of patient at change of shift.    Rise Patience, [place] 04/04/11 [phone]

## 2011-04-04 NOTE — ED Notes (Signed)
Pt has called for more pain medicine. Pa is aware of request

## 2011-04-04 NOTE — ED Notes (Signed)
Have attempted to pull pt meds 2 times but there is delay in meds crossing over to pyxis. There is no witness available to help sign out narcotics

## 2011-04-04 NOTE — ED Notes (Signed)
Pt on call bell requesting more pain meds. States the med given he could feel it but now is back in pain

## 2011-04-04 NOTE — ED Notes (Signed)
Pt has been talked to at length by ir . The procedure has been explained and pt has signed consent for the proceedure

## 2011-04-04 NOTE — Progress Notes (Signed)
Review for Observation status has been completed. 

## 2011-04-04 NOTE — ED Notes (Signed)
Patient sleeping. No complaints at this time.

## 2011-04-04 NOTE — ED Notes (Signed)
Called dr elsner office due to not returning page. They will contact him and have him return the call to c[facility] pa

## 2011-04-04 NOTE — ED Notes (Signed)
Pt now requesting more pain meds

## 2011-04-04 NOTE — Consult Note (Signed)
Reason for Consult: back pain left leg numbness Referring Physician:  Hipolito Bayley M.D.  Ryan Valentine is an 30 y.o. male.  HPI:  Patient is a 30 year old right-handed white male who has had back pain for upwards of 8 months he states that steadily been getting worse has been seen and evaluated by Dr. Lelon Perla. He has had MRIs of the lumbar spine and the thoracic spine and today a new MRI of the lumbar spine was performed. The study reveals the presence of a large herniated nucleus pulposus at L5-S1 on the right side. The patient complains of pain in his back and numbness in his left lower extremity. He denies any significant pain in his right leg. He does not that his back hurts centrally and he gets some pain into the buttock on either side of his back but not into his right leg. He states the numbness in his left leg is in the region of the buttock and the back of the thigh but does not extend below his knee. He has been extremely uncomfortable such that he has not been able to sleep. He has been taking some large doses of narcotic pain medication but notes that this does not control his pain very well. He is here to have whatever surgery may be required to fix this problem.  Past Medical History  Diagnosis Date  . Back pain     Past Surgical History  Procedure Date  . Knee arthroscopy     No family history on file.  Social History:  reports that he has been smoking.  He does not have any smokeless tobacco history on file. He reports that he does not drink alcohol or use illicit drugs.  Allergies:  Allergies  Allergen Reactions  . Penicillins Other (See Comments)    From childhood  . Tramadol Other (See Comments)    Feels clammy  . Erythrocin Palpitations    Medications: I have reviewed the patient's current medications.  No results found for this or any previous visit (from the past 48 hour(s)).  [name]  04/04/2011  *RADIOLOGY REPORT*  Clinical Data:  30 year old male with severe back pain, leg weakness.  MRI LUMBAR SPINE WITHOUT CONTRAST  Technique:  Multiplanar and multiecho pulse sequences of the lumbar spine were obtained without intravenous contrast.  Comparison: Lumbar radiographs 07/22/2010 and earlier.  Findings: Normal lumbar segmentation depicted on the comparison. Visualized abdominal viscera and paraspinal soft tissues are within normal limits.   Visualized lower thoracic spinal cord is normal with conus medularis at L1-L2.  Straightening of lumbar lordosis, otherwise normal lumbar vertebral height and alignment. No marrow edema or evidence of acute osseous abnormality.  T11-T12:  Negative.  T12-L1:  Negative.  L1-L2:  Negative.  L2-L3:  Negative.  L3-L4:  Disc desiccation.  Broad-based right foraminal/lateral recess small disc protrusion with annular tear. Mild facet hypertrophy.  Mild mass effect on the descending right L4 nerve roots.  No significant spinal stenosis.  Mild right L3 foraminal stenosis.  L4-L5:  Disc desiccation.  Broad-based central disc protrusion with annular tear superimposed on mild circumferential disc bulge.  Mild facet hypertrophy. Effacement of the lateral recesses at the bilateral L5 nerve root levels.  No spinal stenosis.  No foraminal stenosis.  L5-S1:  Mild disc desiccation.  Large right lateral recess disc extrusion.  Impingement of the right S1 nerve roots.  Borderline to mild spinal stenosis.  No L5 foraminal involvement.  IMPRESSION: 1.  L5-S1  disc extrusion into the right lateral recess with involvement of the right S1 nerve roots. 2.  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  Original Report Authenticated By: Harley Hallmark, M.D.    Review of Systems  Constitutional: Negative.   HENT: Negative.   Eyes: Negative.   Respiratory: Negative.   Cardiovascular: Negative.   Gastrointestinal: Negative.   Genitourinary: Negative.   Musculoskeletal: Positive  for back pain.  Skin: Negative.   Endo/Heme/Allergies: Negative.   Psychiatric/Behavioral: The patient has insomnia.    Blood pressure 108/66, pulse 45, temperature 98.4 F (36.9 C), temperature source Oral, resp. rate 20, SpO2 97.00%. Physical Exam he is alert in moderate discomfort and distress with back pain. He sits leaning into the back of the stretcher which is raised to approximately 45. He notes that no position is comfortable for him for more than 5 minutes at a time. His back has a moderate amount of paravertebral spasm which is elicited on palpation. Percussion reproduces localized back pain. With the patient flat in bed straight leg raising is positive for back pain at 45 in either lower extremity.  Patrick maneuver is negative.    motor strength in the iliopsoas quadriceps tibialis anterior gastrocnemii is 5 out of 5 bilaterally. Sensation is intact to pin and light touch. Tendon reflexes are 2+ in both patellae and 2+ in both Achilles area Babinski reflex is downgoing.  Assessment/Plan:  patient has an extruded fragment of disc at L5-S1 on the right side. He has mostly back pain and complaints of left lower extremity pain. The patient is neurologically intact he has an annular tear centrally at L4-L5. He has some early degenerative changes of the disc at L3-L4.    at this time the patient does not have a clear-cut surgical problem. He has been treated conservatively but has not had a trial of an epidural steroid injection. I have recommended this to him today and we will arrange it with interventional radiology at the hospital to be done this afternoon if this gives him good control of his pain and he will be discharged home. In the meantime will give them a singular intravenous dose of Toradol 30 mg to help control his pain.  Nehemiah Montee J 04/04/2011, 12:52 PM

## 2011-04-04 NOTE — Procedures (Signed)
Lumber epidural steroid injection under fluoro L L5-S1 No complication No blood loss. See complete dictation in [facility].

## 2011-04-04 NOTE — H&P (Signed)
Ryan Valentine is an 30 y.o. male.   Chief Complaint: low back pain; left leg weakness and numbness more severe, poorly controlled pain. Scheduled for OR 05/2011 when insurance available. HPI: No new recent injury.  Worsening of LBP with radicular symptoms of L>R leg with numbness, weakness of anterior left thigh down to knee.  Pain in right buttock, "starting to hurt some in right leg."  Past Medical History  Diagnosis Date  . Back pain     Past Surgical History  Procedure Date  . Knee arthroscopy     No family history on file. Social History:  reports that he has been smoking.  He does not have any smokeless tobacco history on file. He reports that he does not drink alcohol or use illicit drugs.  Allergies:  Allergies  Allergen Reactions  . Penicillins Other (See Comments)    From childhood  . Tramadol Other (See Comments)    Feels clammy  . Erythrocin Palpitations    Medications Prior to Admission  Medication Dose Route Frequency Provider Last Rate Last Dose  . HYDROmorphone (DILAUDID) injection 1 mg  1 mg Intravenous Once Rise Patience, PA   1 mg at 04/04/11 0747  . HYDROmorphone (DILAUDID) injection 1 mg  1 mg Intravenous Once Rise Patience, PA   1 mg at 04/04/11 0904  . HYDROmorphone (DILAUDID) injection 2 mg  2 mg Intramuscular Once [employer]. Colon Branch, MD   2 mg at 04/03/11 0330  . ketorolac (TORADOL) 30 MG/ML injection 30 mg  30 mg Intravenous Once Shary Key Elsner   30 mg at 04/04/11 1242  . LORazepam (ATIVAN) injection 2 mg  2 mg Intravenous Once Carlisle Beers Molpus, MD   2 mg at 04/04/11 0603  . methocarbamol (ROBAXIN) 1,000 mg in dextrose 5 % 50 mL IVPB  1,000 mg Intravenous Once Laurena Bering, PHARMD   1,000 mg at 04/04/11 1129  . ondansetron (ZOFRAN) injection 4 mg  4 mg Intravenous Q6H PRN Carlisle Beers Molpus, MD   4 mg at 04/04/11 0748  . ondansetron (ZOFRAN) tablet 4 mg  4 mg Oral Once [employer]. Colon Branch, MD   4 mg at 04/03/11 0331  . DISCONTD: HYDROmorphone (DILAUDID) injection  1 mg  1 mg Intravenous Once Carlisle Beers Molpus, MD      . DISCONTD: HYDROmorphone (DILAUDID) tablet 2 mg  2 mg Oral Q2H PRN Carlisle Beers Molpus, MD   2 mg at 04/04/11 0514  . DISCONTD: methocarbamol (ROBAXIN) injection 1,000 mg  1,000 mg Intravenous Once Rise Patience, PA       Medications Prior to Admission  Medication Sig Dispense Refill  . Diazepam (VALIUM PO) Take 5 mg by mouth at bedtime as needed. For sleep      . HYDROmorphone (DILAUDID) 4 MG tablet Take 1 tablet (4 mg total) by mouth every 4 (four) hours as needed for pain.  20 tablet  0  . morphine (MS CONTIN) 30 MG 12 hr tablet Take 1 tablet (30 mg total) by mouth 2 (two) times daily.  20 tablet  0  . acetaminophen (TYLENOL) 650 MG CR tablet Take 650 mg by mouth every 8 (eight) hours as needed. pain         No results found for this or any previous visit (from the past 48 hour(s)). [name]  04/04/2011  *RADIOLOGY REPORT*  Clinical Data: 30 year old male with severe back pain, leg weakness.  MRI LUMBAR SPINE WITHOUT CONTRAST  Technique:  Multiplanar and multiecho pulse sequences of the lumbar spine were obtained without intravenous contrast.  Comparison: Lumbar radiographs 07/22/2010 and earlier.  Findings: Normal lumbar segmentation depicted on the comparison. Visualized abdominal viscera and paraspinal soft tissues are within normal limits.   Visualized lower thoracic spinal cord is normal with conus medularis at L1-L2.  Straightening of lumbar lordosis, otherwise normal lumbar vertebral height and alignment. No marrow edema or evidence of acute osseous abnormality.  T11-T12:  Negative.  T12-L1:  Negative.  L1-L2:  Negative.  L2-L3:  Negative.  L3-L4:  Disc desiccation.  Broad-based right foraminal/lateral recess small disc protrusion with annular tear. Mild facet hypertrophy.  Mild mass effect on the descending right L4 nerve roots.  No significant spinal stenosis.  Mild right L3 foraminal stenosis.  L4-L5:  Disc desiccation.   Broad-based central disc protrusion with annular tear superimposed on mild circumferential disc bulge.  Mild facet hypertrophy. Effacement of the lateral recesses at the bilateral L5 nerve root levels.  No spinal stenosis.  No foraminal stenosis.  L5-S1:  Mild disc desiccation.  Large right lateral recess disc extrusion.  Impingement of the right S1 nerve roots.  Borderline to mild spinal stenosis.  No L5 foraminal involvement.  IMPRESSION: 1.  L5-S1 disc extrusion into the right lateral recess with involvement of the right S1 nerve roots. 2.  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  Original Report Authenticated By: Harley Hallmark, M.D.    Review of Systems  HENT: Negative.   Respiratory: Negative.   Cardiovascular: Negative.   Musculoskeletal: Positive for back pain.  Skin: Negative.   Neurological: Positive for sensory change, focal weakness and weakness.       Left leg > right   Psychiatric/Behavioral: Negative.     Blood pressure 108/66, pulse 45, temperature 98.4 F (36.9 C), temperature source Oral, resp. rate 20, SpO2 97.00%. Physical Exam  Constitutional: He is oriented to person, place, and time. He appears well-developed and well-nourished. He appears distressed.  HENT:  Head: Normocephalic and atraumatic.  Respiratory: Breath sounds normal.  GI: Bowel sounds are normal.  Musculoskeletal: He exhibits tenderness. He exhibits no edema.  Neurological: He is alert and oriented to person, place, and time.  Skin: Skin is warm and dry.     Assessment/Plan MRI shows changes from prior MRI with worsening symptoms.  Patient made aware of epidural injection availability to assist with pain control.  Procedure details, hopeful benefits and risks discussed with his apparent understanding.  Patient is in agreement to proceed with injection.  If shows some improvement - can be scheduled as a OP for an additional injection in 2  weeks.  CAMPBELL,PAMELA D, PA-C 04/04/2011, 1:09 PM

## 2011-06-01 ENCOUNTER — Encounter (HOSPITAL_COMMUNITY): Payer: Self-pay

## 2011-06-01 ENCOUNTER — Other Ambulatory Visit (HOSPITAL_COMMUNITY): Payer: Self-pay | Admitting: *Deleted

## 2011-06-01 ENCOUNTER — Encounter (HOSPITAL_COMMUNITY)
Admission: RE | Admit: 2011-06-01 | Discharge: 2011-06-01 | Disposition: A | Discharge status: Home / Self Care | Payer: Medicaid Other | Source: Ambulatory Visit | Attending: Neurosurgery | Admitting: Neurosurgery

## 2011-06-01 ENCOUNTER — Encounter (HOSPITAL_COMMUNITY): Payer: Self-pay | Admitting: Pharmacy Technician

## 2011-06-01 HISTORY — DX: Headache: R51

## 2011-06-01 HISTORY — DX: Gastro-esophageal reflux disease without esophagitis: K21.9

## 2011-06-01 LAB — DIFFERENTIAL
Basophils Absolute: 0.1 10*3/uL (ref 0.0–0.1)
Eosinophils Relative: 3 % (ref 0–5)
Lymphocytes Relative: 35 % (ref 12–46)
Lymphs Abs: 2.3 10*3/uL (ref 0.7–4.0)
Monocytes Absolute: 0.4 10*3/uL (ref 0.1–1.0)
Neutro Abs: 3.7 10*3/uL (ref 1.7–7.7)

## 2011-06-01 LAB — CBC
HCT: 42.2 % (ref 39.0–52.0)
MCV: 92.5 fL (ref 78.0–100.0)
Platelets: 201 10*3/uL (ref 150–400)
RBC: 4.56 MIL/uL (ref 4.22–5.81)
RDW: 12.5 % (ref 11.5–15.5)
WBC: 6.7 10*3/uL (ref 4.0–10.5)

## 2011-06-01 LAB — SURGICAL PCR SCREEN: MRSA, PCR: NEGATIVE

## 2011-06-01 MED ORDER — DEXAMETHASONE SODIUM PHOSPHATE 10 MG/ML IJ SOLN: 10.0000 mg | Freq: Once | INTRAMUSCULAR | Status: DC

## 2011-06-01 MED ORDER — CEFAZOLIN SODIUM 1-5 GM-% IV SOLN: 1.0000 g | Freq: Once | INTRAVENOUS | Status: DC

## 2011-06-01 MED ORDER — CEFAZOLIN SODIUM-DEXTROSE 2-3 GM-% IV SOLR: 2.0000 g | INTRAVENOUS | Status: DC

## 2011-06-01 NOTE — Pre-Procedure Instructions (Signed)
22 Ryan Valentine  06/01/2011   Your procedure is scheduled on:  06/05/2011  Report to [facility] at 1130 AM.  Call this number if you have problems the morning of surgery: [phone]   Remember:   Do not eat food:After Midnight.  May have clear liquids: up to 4 Hours before arrival.  Clear liquids include soda, tea, black coffee, apple or grape juice, broth.  Take these medicines the morning of surgery with A SIP OF WATER: methadone  norco   Do not wear jewelry, make-up or nail polish.  Do not wear lotions, powders, or perfumes. You may wear deodorant.  Do not shave 48 hours prior to surgery.  Do not bring valuables to the hospital.  Contacts, dentures or bridgework may not be worn into surgery.  Leave suitcase in the car. After surgery it may be brought to your room.  For patients admitted to the hospital, checkout time is 11:00 AM the day of discharge.   Patients discharged the day of surgery will not be allowed to drive home.  Name and phone number of your driver: mother [name] [phone]  Special Instructions: CHG Shower Use Special Wash: 1/2 bottle night before surgery and 1/2 bottle morning of surgery.   Please read over the following fact sheets that you were given: Pain Booklet, Coughing and Deep Breathing, MRSA Information and Surgical Site Infection Prevention

## 2011-06-05 ENCOUNTER — Encounter (HOSPITAL_COMMUNITY)
Admission: RE | Disposition: A | Discharge status: Home / Self Care | Payer: Self-pay | Source: Ambulatory Visit | Attending: Neurosurgery

## 2011-06-05 ENCOUNTER — Encounter (HOSPITAL_COMMUNITY): Payer: Self-pay | Admitting: Anesthesiology

## 2011-06-05 ENCOUNTER — Encounter (HOSPITAL_COMMUNITY): Payer: Self-pay | Admitting: *Deleted

## 2011-06-05 ENCOUNTER — Inpatient Hospital Stay (HOSPITAL_COMMUNITY)
Admission: RE | Admit: 2011-06-05 | Discharge: 2011-06-06 | DRG: 491 | Disposition: A | Discharge status: Home / Self Care | Payer: Medicaid Other | Source: Ambulatory Visit | Attending: Neurosurgery | Admitting: Neurosurgery

## 2011-06-05 ENCOUNTER — Ambulatory Visit (HOSPITAL_COMMUNITY): Payer: Medicaid Other

## 2011-06-05 ENCOUNTER — Ambulatory Visit (HOSPITAL_COMMUNITY): Payer: Medicaid Other | Admitting: Anesthesiology

## 2011-06-05 DIAGNOSIS — G709 Myoneural disorder, unspecified: Secondary | ICD-10-CM | POA: Diagnosis present

## 2011-06-05 DIAGNOSIS — Z888 Allergy status to other drugs, medicaments and biological substances status: Secondary | ICD-10-CM

## 2011-06-05 DIAGNOSIS — F17200 Nicotine dependence, unspecified, uncomplicated: Secondary | ICD-10-CM | POA: Diagnosis present

## 2011-06-05 DIAGNOSIS — K219 Gastro-esophageal reflux disease without esophagitis: Secondary | ICD-10-CM | POA: Diagnosis present

## 2011-06-05 DIAGNOSIS — M5126 Other intervertebral disc displacement, lumbar region: Principal | ICD-10-CM | POA: Diagnosis present

## 2011-06-05 DIAGNOSIS — Z880 Allergy status to penicillin: Secondary | ICD-10-CM

## 2011-06-05 DIAGNOSIS — R51 Headache: Secondary | ICD-10-CM | POA: Diagnosis present

## 2011-06-05 HISTORY — PX: LUMBAR LAMINECTOMY/DECOMPRESSION MICRODISCECTOMY: SHX5026

## 2011-06-05 LAB — TYPE AND SCREEN
ABO/RH(D): O NEG
Antibody Screen: NEGATIVE

## 2011-06-05 SURGERY — LUMBAR LAMINECTOMY/DECOMPRESSION MICRODISCECTOMY
Anesthesia: General | Site: Back | Laterality: Right | Wound class: Clean

## 2011-06-05 MED ORDER — MENTHOL 3 MG MT LOZG: 1.0000 | LOZENGE | OROMUCOSAL | Status: DC | PRN

## 2011-06-05 MED ORDER — PANTOPRAZOLE SODIUM 40 MG PO TBEC: 40.0000 mg | DELAYED_RELEASE_TABLET | Freq: Every day | ORAL | Status: DC

## 2011-06-05 MED ORDER — DEXAMETHASONE SODIUM PHOSPHATE 10 MG/ML IJ SOLN
INTRAMUSCULAR | Status: AC
  Administered 2011-06-05: 10 mg via INTRAVENOUS
  Filled 2011-06-05: qty 1

## 2011-06-05 MED ORDER — ROCURONIUM BROMIDE 100 MG/10ML IV SOLN
INTRAVENOUS | Status: DC | PRN
  Administered 2011-06-05: 10 mg via INTRAVENOUS
  Administered 2011-06-05: 50 mg via INTRAVENOUS

## 2011-06-05 MED ORDER — 0.9 % SODIUM CHLORIDE (POUR BTL) OPTIME
TOPICAL | Status: DC | PRN
  Administered 2011-06-05: 1000 mL

## 2011-06-05 MED ORDER — HYDROCODONE-ACETAMINOPHEN 10-325 MG PO TABS: 1.0000 | ORAL_TABLET | ORAL | Status: DC | PRN

## 2011-06-05 MED ORDER — FENTANYL CITRATE 0.05 MG/ML IJ SOLN
INTRAMUSCULAR | Status: DC | PRN
  Administered 2011-06-05: 50 ug via INTRAVENOUS

## 2011-06-05 MED ORDER — ONDANSETRON HCL 4 MG/2ML IJ SOLN: 4.0000 mg | INTRAMUSCULAR | Status: DC | PRN

## 2011-06-05 MED ORDER — SODIUM CHLORIDE 0.9 % IJ SOLN
3.0000 mL | INTRAMUSCULAR | Status: DC | PRN
  Administered 2011-06-06: 3 mL via INTRAVENOUS

## 2011-06-05 MED ORDER — THROMBIN 5000 UNITS EX KIT
PACK | CUTANEOUS | Status: DC | PRN
  Administered 2011-06-05 (×2): 5000 [IU] via TOPICAL

## 2011-06-05 MED ORDER — PROPOFOL 10 MG/ML IV EMUL
INTRAVENOUS | Status: DC | PRN
  Administered 2011-06-05: 200 mg via INTRAVENOUS

## 2011-06-05 MED ORDER — OXYCODONE-ACETAMINOPHEN 5-325 MG PO TABS
1.0000 | ORAL_TABLET | ORAL | Status: DC | PRN
  Administered 2011-06-05 – 2011-06-06 (×3): 2 via ORAL
  Filled 2011-06-05 (×3): qty 2

## 2011-06-05 MED ORDER — BACITRACIN 50000 UNITS IM SOLR
INTRAMUSCULAR | Status: AC
  Filled 2011-06-05: qty 1

## 2011-06-05 MED ORDER — SODIUM CHLORIDE 0.9 % IV SOLN
INTRAVENOUS | Status: AC
  Filled 2011-06-05: qty 500

## 2011-06-05 MED ORDER — DOCUSATE SODIUM 100 MG PO CAPS
100.0000 mg | ORAL_CAPSULE | Freq: Two times a day (BID) | ORAL | Status: DC
  Administered 2011-06-06: 100 mg via ORAL
  Filled 2011-06-05: qty 1

## 2011-06-05 MED ORDER — ACETAMINOPHEN 650 MG RE SUPP: 650.0000 mg | RECTAL | Status: DC | PRN

## 2011-06-05 MED ORDER — LACTATED RINGERS IV SOLN
INTRAVENOUS | Status: DC | PRN
  Administered 2011-06-05 (×3): via INTRAVENOUS

## 2011-06-05 MED ORDER — SODIUM CHLORIDE 0.9 % IR SOLN
Status: DC | PRN
  Administered 2011-06-05: 15:00:00

## 2011-06-05 MED ORDER — KETOROLAC TROMETHAMINE 30 MG/ML IJ SOLN
INTRAMUSCULAR | Status: DC | PRN
  Administered 2011-06-05: 30 mg via INTRAVENOUS

## 2011-06-05 MED ORDER — KETOROLAC TROMETHAMINE 30 MG/ML IJ SOLN
30.0000 mg | Freq: Four times a day (QID) | INTRAMUSCULAR | Status: DC
  Administered 2011-06-05: 30 mg via INTRAVENOUS
  Filled 2011-06-05 (×5): qty 1

## 2011-06-05 MED ORDER — ONDANSETRON HCL 4 MG/2ML IJ SOLN: 4.0000 mg | Freq: Once | INTRAMUSCULAR | Status: DC | PRN

## 2011-06-05 MED ORDER — CEFAZOLIN SODIUM-DEXTROSE 2-3 GM-% IV SOLR
INTRAVENOUS | Status: AC
  Filled 2011-06-05: qty 50

## 2011-06-05 MED ORDER — VANCOMYCIN HCL IN DEXTROSE 1-5 GM/200ML-% IV SOLN
INTRAVENOUS | Status: AC
  Administered 2011-06-05: 1000 mg via INTRAVENOUS
  Filled 2011-06-05: qty 200

## 2011-06-05 MED ORDER — CYCLOBENZAPRINE HCL 10 MG PO TABS: 10.0000 mg | ORAL_TABLET | Freq: Three times a day (TID) | ORAL | Status: DC | PRN

## 2011-06-05 MED ORDER — ZOLPIDEM TARTRATE 5 MG PO TABS: 10.0000 mg | ORAL_TABLET | Freq: Every evening | ORAL | Status: DC | PRN

## 2011-06-05 MED ORDER — IBUPROFEN 800 MG PO TABS
800.0000 mg | ORAL_TABLET | Freq: Three times a day (TID) | ORAL | Status: DC | PRN
  Filled 2011-06-05: qty 1

## 2011-06-05 MED ORDER — HYDROMORPHONE HCL PF 1 MG/ML IJ SOLN
0.5000 mg | INTRAMUSCULAR | Status: DC | PRN
  Administered 2011-06-05: 1 mg via INTRAVENOUS
  Administered 2011-06-06: 0.5 mg via INTRAVENOUS
  Administered 2011-06-06: 1 mg via INTRAVENOUS
  Filled 2011-06-05 (×3): qty 1

## 2011-06-05 MED ORDER — HYDROCODONE-ACETAMINOPHEN 5-325 MG PO TABS: 1.0000 | ORAL_TABLET | ORAL | Status: DC | PRN

## 2011-06-05 MED ORDER — SODIUM CHLORIDE 0.9 % IJ SOLN
3.0000 mL | Freq: Two times a day (BID) | INTRAMUSCULAR | Status: DC
  Administered 2011-06-06: 3 mL via INTRAVENOUS

## 2011-06-05 MED ORDER — SODIUM CHLORIDE 0.9 % IV SOLN: 250.0000 mL | INTRAVENOUS | Status: DC

## 2011-06-05 MED ORDER — HYDROMORPHONE HCL PF 1 MG/ML IJ SOLN: 0.2500 mg | INTRAMUSCULAR | Status: DC | PRN

## 2011-06-05 MED ORDER — HEMOSTATIC AGENTS (NO CHARGE) OPTIME
TOPICAL | Status: DC | PRN
  Administered 2011-06-05: 1 via TOPICAL

## 2011-06-05 MED ORDER — METHADONE HCL 10 MG PO TABS: 20.0000 mg | ORAL_TABLET | Freq: Three times a day (TID) | ORAL | Status: DC | PRN

## 2011-06-05 MED ORDER — HYDROMORPHONE HCL PF 1 MG/ML IJ SOLN
INTRAMUSCULAR | Status: AC
  Filled 2011-06-05: qty 1

## 2011-06-05 MED ORDER — BUPIVACAINE HCL (PF) 0.25 % IJ SOLN
INTRAMUSCULAR | Status: DC | PRN
  Administered 2011-06-05: 20 mL

## 2011-06-05 MED ORDER — PHENOL 1.4 % MT LIQD: 1.0000 | OROMUCOSAL | Status: DC | PRN

## 2011-06-05 MED ORDER — ACETAMINOPHEN 325 MG PO TABS: 650.0000 mg | ORAL_TABLET | ORAL | Status: DC | PRN

## 2011-06-05 MED ORDER — VANCOMYCIN HCL IN DEXTROSE 1-5 GM/200ML-% IV SOLN
1000.0000 mg | Freq: Once | INTRAVENOUS | Status: AC
  Administered 2011-06-05: 1000 mg via INTRAVENOUS
  Filled 2011-06-05: qty 200

## 2011-06-05 MED ORDER — NEOSTIGMINE METHYLSULFATE 1 MG/ML IJ SOLN
INTRAMUSCULAR | Status: DC | PRN
  Administered 2011-06-05: 4 mg via INTRAVENOUS

## 2011-06-05 MED ORDER — SENNA 8.6 MG PO TABS
1.0000 | ORAL_TABLET | Freq: Two times a day (BID) | ORAL | Status: DC
  Administered 2011-06-06: 8.6 mg via ORAL
  Filled 2011-06-05 (×2): qty 1

## 2011-06-05 MED ORDER — ONDANSETRON HCL 4 MG/2ML IJ SOLN
INTRAMUSCULAR | Status: DC | PRN
  Administered 2011-06-05: 4 mg via INTRAVENOUS

## 2011-06-05 MED ORDER — GLYCOPYRROLATE 0.2 MG/ML IJ SOLN
INTRAMUSCULAR | Status: DC | PRN
  Administered 2011-06-05: .8 mg via INTRAVENOUS

## 2011-06-05 MED ORDER — CALCIUM CARBONATE ANTACID 500 MG PO CHEW
1.0000 | CHEWABLE_TABLET | Freq: Every day | ORAL | Status: DC | PRN
  Filled 2011-06-05: qty 1

## 2011-06-05 MED ORDER — ALUM & MAG HYDROXIDE-SIMETH 200-200-20 MG/5ML PO SUSP: 30.0000 mL | Freq: Four times a day (QID) | ORAL | Status: DC | PRN

## 2011-06-05 SURGICAL SUPPLY — 51 items
BAG DECANTER FOR FLEXI CONT (MISCELLANEOUS) ×2 IMPLANT
BENZOIN TINCTURE PRP APPL 2/3 (GAUZE/BANDAGES/DRESSINGS) ×2 IMPLANT
BLADE SURG ROTATE 9660 (MISCELLANEOUS) ×2 IMPLANT
BRUSH SCRUB EZ PLAIN DRY (MISCELLANEOUS) ×2 IMPLANT
BUR CUTTER 7.0 ROUND (BURR) ×2 IMPLANT
CANISTER SUCTION 2500CC (MISCELLANEOUS) ×2 IMPLANT
CLOTH BEACON ORANGE TIMEOUT ST (SAFETY) ×2 IMPLANT
CONT SPEC 4OZ CLIKSEAL STRL BL (MISCELLANEOUS) ×2 IMPLANT
DECANTER SPIKE VIAL GLASS SM (MISCELLANEOUS) ×2 IMPLANT
DERMABOND ADVANCED (GAUZE/BANDAGES/DRESSINGS) ×1
DERMABOND ADVANCED .7 DNX12 (GAUZE/BANDAGES/DRESSINGS) ×1 IMPLANT
DRAPE LAPAROTOMY 100X72X124 (DRAPES) ×2 IMPLANT
DRAPE MICROSCOPE LEICA (MISCELLANEOUS) ×2 IMPLANT
DRAPE MICROSCOPE ZEISS OPMI (DRAPES) IMPLANT
DRAPE POUCH INSTRU U-SHP 10X18 (DRAPES) ×2 IMPLANT
DRAPE PROXIMA HALF (DRAPES) IMPLANT
DRAPE SURG 17X23 STRL (DRAPES) ×4 IMPLANT
ELECT REM PT RETURN 9FT ADLT (ELECTROSURGICAL) ×2
ELECTRODE REM PT RTRN 9FT ADLT (ELECTROSURGICAL) ×1 IMPLANT
GAUZE SPONGE 4X4 16PLY XRAY LF (GAUZE/BANDAGES/DRESSINGS) IMPLANT
GLOVE BIOGEL PI IND STRL 7.0 (GLOVE) ×1 IMPLANT
GLOVE BIOGEL PI IND STRL 8 (GLOVE) ×1 IMPLANT
GLOVE BIOGEL PI INDICATOR 7.0 (GLOVE) ×1
GLOVE BIOGEL PI INDICATOR 8 (GLOVE) ×1
GLOVE ECLIPSE 7.5 STRL STRAW (GLOVE) ×2 IMPLANT
GLOVE ECLIPSE 8.5 STRL (GLOVE) ×2 IMPLANT
GLOVE EXAM NITRILE LRG STRL (GLOVE) IMPLANT
GLOVE EXAM NITRILE MD LF STRL (GLOVE) ×2 IMPLANT
GLOVE EXAM NITRILE XL STR (GLOVE) IMPLANT
GLOVE EXAM NITRILE XS STR PU (GLOVE) IMPLANT
GLOVE SURG SS PI 6.5 STRL IVOR (GLOVE) ×4 IMPLANT
GOWN BRE IMP SLV AUR LG STRL (GOWN DISPOSABLE) IMPLANT
GOWN BRE IMP SLV AUR XL STRL (GOWN DISPOSABLE) ×4 IMPLANT
GOWN STRL REIN 2XL LVL4 (GOWN DISPOSABLE) ×2 IMPLANT
KIT BASIN OR (CUSTOM PROCEDURE TRAY) ×2 IMPLANT
KIT ROOM TURNOVER OR (KITS) ×2 IMPLANT
NEEDLE HYPO 22GX1.5 SAFETY (NEEDLE) ×2 IMPLANT
NEEDLE SPNL 22GX3.5 QUINCKE BK (NEEDLE) ×2 IMPLANT
NS IRRIG 1000ML POUR BTL (IV SOLUTION) ×2 IMPLANT
PACK LAMINECTOMY NEURO (CUSTOM PROCEDURE TRAY) ×2 IMPLANT
PAD ARMBOARD 7.5X6 YLW CONV (MISCELLANEOUS) ×6 IMPLANT
RUBBERBAND STERILE (MISCELLANEOUS) ×4 IMPLANT
SPONGE GAUZE 4X4 12PLY (GAUZE/BANDAGES/DRESSINGS) ×2 IMPLANT
SPONGE SURGIFOAM ABS GEL SZ50 (HEMOSTASIS) ×2 IMPLANT
STRIP CLOSURE SKIN 1/2X4 (GAUZE/BANDAGES/DRESSINGS) ×2 IMPLANT
SUT VIC AB 2-0 CT1 18 (SUTURE) ×2 IMPLANT
SUT VIC AB 3-0 SH 8-18 (SUTURE) ×2 IMPLANT
SYR 20ML ECCENTRIC (SYRINGE) ×2 IMPLANT
TOWEL OR 17X24 6PK STRL BLUE (TOWEL DISPOSABLE) ×2 IMPLANT
TOWEL OR 17X26 10 PK STRL BLUE (TOWEL DISPOSABLE) ×2 IMPLANT
WATER STERILE IRR 1000ML POUR (IV SOLUTION) ×2 IMPLANT

## 2011-06-05 NOTE — Brief Op Note (Signed)
06/05/2011  3:26 PM  PATIENT:  Ryan Valentine  31 y.o. 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   Delay start of Pharmacological VTE agent (>24hrs) due to surgical blood loss or risk of bleeding:  {YES

## 2011-06-05 NOTE — Preoperative (Signed)
Beta Blockers   Reason not to administer Beta Blockers:Not Applicable 

## 2011-06-05 NOTE — Op Note (Signed)
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  Postoperative diagnosis: Same  Procedure Name: Right L5-S1 laminotomy microdiscectomy  Surgeon:Talynn Lebon A.Kynzleigh Bandel, M.D.  Asst. Surgeon: Shirlean Kelly  Anesthesia: General  Indication: 31 year old male with back and right lower extremity pain consistent with a right S1 radiculopathy. Workup demonstrates evidence of a large right-sided L5-S1 disc herniation. Patient presents now for laminotomy and microdiscectomy for hopeful improvement of the symptoms.  Operative note: After anesthesia was induced patient was positioned prone onto a Wilson frame and a properly padded. Patient's lumbar region was prepped and draped sterilely. 10 blade is used to make a linear skin incision overlying the L5-S1 interspace. This is carried down sharply in the midline. Subperiosteal dissection then performed so the lamina and facet joints of L5 and S1 on the right. Deep self retaining retractors placed intraoperative x-rays taken level was confirmed. Laminotomy was then performed using the high-speed drill and Kerrison rongeurs. Underlying thecal sac and right S1 nerve root were identified. Epidermides pus quite related and cut Buell Parcel unit used for microdissection of the right side S1 nerve root and underlying disc herniation. S1 nerve root and thecal sac were gently mobilized track was midline. Disc herniation was readily apparent. Disc herniation was then incised with a 15 blade in a rectangular fashion. Y. disc space clear was achieved using pituitary rongeurs operative of the rongeurs and Epstein curettes all of its the disc herniation or other sector. All loose are obvious degenerative disc spurs then removed and interspace. At this point a very thorough discectomy been achieved. There is no evidence of injury to thecal sac and nerve roots. Wound is  then irrigated out solution. Gelfoam was placed topically for hemostasis which Ascent be good. Microscope retractor system were removed. Hemostasis muscle to achieved with electric are. Wounds and close in layers with Vicryl sutures. Steri-Strips triggers were applied. There were no apparent outpatient. Patient tolerated well and returned to the recovery room postop.

## 2011-06-05 NOTE — Progress Notes (Signed)
Pt. Extremely sleepy, stating he has 12/10 pain, then laughs and states no it doesn;t hurt that bad and falls asleep

## 2011-06-05 NOTE — H&P (Signed)
Ryan Valentine is an 31 y.o. male.   Chief Complaint:  Back and right lower extremity pain HPI: 31 year old male with back and right lower chamois pain. Symptoms of failed conservative management. Pain radiates into recently typical right-sided S1 radicular pattern. Patient does note some numbness and paresthesias. Denies weakness. No bowel or bladder dysfunction. No current left-sided symptoms. Workup demonstrates a rather large right-sided L5-S1 disc herniation with compression upon the thecal sac and right-sided S1 nerve root. Patient presents now for laminotomy and microdiscectomy in hopes of improving his symptoms.  Past Medical History  Diagnosis Date  . Back pain   . Headache     occ headaches  . Neuromuscular disorder   . GERD (gastroesophageal reflux disease)     Past Surgical History  Procedure Date  . Knee arthroscopy     History reviewed. No pertinent family history. Social History:  reports that he has been smoking.  He does not have any smokeless tobacco history on file. He reports that he does not drink alcohol or use illicit drugs.  Allergies:  Allergies  Allergen Reactions  . Methadone     Makes my head feel  Funny   . Ms Contin (Morphine Sulfate) Other (See Comments)    Makes my head feel funny   . Penicillins Other (See Comments)    From childhood  . Tramadol Other (See Comments)    Feels clammy  . Erythrocin Palpitations    Medications Prior to Admission  Medication Dose Route Frequency Provider Last Rate Last Dose  . bacitracin 40981 UNITS injection           . ceFAZolin (ANCEF) 2-3 GM-% IVPB SOLR           . dexamethasone (DECADRON) 10 MG/ML injection           . HYDROmorphone (DILAUDID) injection 0.25-0.5 mg  0.25-0.5 mg Intravenous Q5 min PRN Rivka Barbara, MD      . HYDROmorphone (DILAUDID) injection 0.25-0.5 mg  0.25-0.5 mg Intravenous Q5 min PRN Rivka Barbara, MD      . ondansetron [facility]) injection 4 mg  4 mg Intravenous Once PRN  Rivka Barbara, MD      . ondansetron [facility]) injection 4 mg  4 mg Intravenous Once PRN Rivka Barbara, MD      . sodium chloride 0.9 % infusion            Medications Prior to Admission  Medication Sig Dispense Refill  . calcium carbonate (TUMS - DOSED IN MG ELEMENTAL CALCIUM) 500 MG chewable tablet Chew 1 tablet by mouth daily as needed. Stomach upset      . HYDROcodone-acetaminophen (NORCO) 10-325 MG per tablet Take 1 tablet by mouth every 4 (four) hours as needed. For pain      . ibuprofen (ADVIL,MOTRIN) 200 MG tablet Take 800 mg by mouth every 8 (eight) hours as needed. For pain      . methadone (DOLOPHINE) 10 MG tablet Take 20 mg by mouth every 8 (eight) hours as needed. For pain      . omeprazole (PRILOSEC) 20 MG capsule Take 20 mg by mouth daily as needed. Acid reflux        No results found for this or any previous visit (from the past 48 hour(s)). No results found.  Review of Systems  Constitutional: Negative.   HENT: Negative.   Eyes: Negative.   Respiratory: Negative.   Cardiovascular: Negative.   Gastrointestinal: Negative.   Genitourinary: Negative.  Musculoskeletal: Negative.   Skin: Negative.   Neurological: Negative.   Endo/Heme/Allergies: Negative.   All other systems reviewed and are negative.    Blood pressure 134/82, pulse 77, temperature 98.1 F (36.7 C), temperature source Oral, resp. rate 20, SpO2 98.00%. Physical Exam  Nursing note and vitals reviewed. Constitutional: He is oriented to person, place, and time. He appears well-developed and well-nourished.  HENT:  Head: Normocephalic and atraumatic.  Right Ear: External ear normal.  Left Ear: External ear normal.  Eyes: Conjunctivae and EOM are normal. Pupils are equal, round, and reactive to light.  Neck: Normal range of motion. Neck supple. No tracheal deviation present. No thyromegaly present.  Cardiovascular: Normal rate, regular rhythm, normal heart sounds and intact distal pulses.    Respiratory: Effort normal and breath sounds normal. No respiratory distress.  GI: Soft. Bowel sounds are normal. He exhibits no distension. There is no tenderness.  Musculoskeletal: Normal range of motion. He exhibits no edema and no tenderness.  Neurological: He is alert and oriented to person, place, and time. He has normal reflexes. He displays normal reflexes. No cranial nerve deficit. He exhibits normal muscle tone. Coordination normal.  Skin: Skin is warm and dry. No rash noted. No erythema. No pallor.  Psychiatric: He has a normal mood and affect. His behavior is normal. Judgment and thought content normal.    Straight leg raising positive on the right negative on the left. Motor examination with some mild weakness of his gastrocnemius muscle otherwise motor strength intact sensory examination was decreased sensation over light touch in his right S1 dermatome. Assessment/Plan Right L5-S1 herniated pulposus with radiculopathy. Plan right-sided L5-S1 laminotomy and microdiscectomy. Risks and benefits have been explained patient wishes to proceed  Ryan Valentine A 06/05/2011, 1:47 PM

## 2011-06-05 NOTE — Transfer of Care (Signed)
Immediate Anesthesia Transfer of Care Note  Patient: Ryan Valentine  Procedure(s) Performed:  LUMBAR LAMINECTOMY/DECOMPRESSION MICRODISCECTOMY - Right Lumbar five-Sacral one laminectomy/microdiscectomy  Patient Location: PACU  Anesthesia Type: General  Level of Consciousness: oriented and patient cooperative  Airway & Oxygen Therapy: Patient Spontanous Breathing and Patient connected to nasal cannula oxygen  Post-op Assessment: Report given to PACU RN, Post -op Vital signs reviewed and stable and Patient moving all extremities X 4  Post vital signs: Reviewed and stable  Complications: No apparent anesthesia complications

## 2011-06-05 NOTE — Anesthesia Preprocedure Evaluation (Signed)
Anesthesia Evaluation  Patient identified by MRN, date of birth, ID band Patient awake    Reviewed: Allergy & Precautions, H&P , NPO status , Patient's Chart, lab work & pertinent test results  Airway Mallampati: I TM Distance: >3 FB     Dental  (+) Teeth Intact   Pulmonary Current Smoker,          Cardiovascular neg cardio ROS regular Normal    Neuro/Psych  Headaches,  Neuromuscular disease    GI/Hepatic Neg liver ROS,   Endo/Other  Negative Endocrine ROS  Renal/GU negative Renal ROS  Genitourinary negative   Musculoskeletal   Abdominal Normal abdominal exam  (+)   Peds  Hematology negative hematology ROS (+)   Anesthesia Other Findings   Reproductive/Obstetrics                           Anesthesia Physical Anesthesia Plan  ASA: I  Anesthesia Plan: General ETT   Post-op Pain Management:    Induction:   Airway Management Planned:   Additional Equipment:   Intra-op Plan:   Post-operative Plan:   Informed Consent: I have reviewed the patients History and Physical, chart, labs and discussed the procedure including the risks, benefits and alternatives for the proposed anesthesia with the patient or authorized representative who has indicated his/her understanding and acceptance.     Plan Discussed with: Anesthesiologist, CRNA and Surgeon  Anesthesia Plan Comments:         Anesthesia Quick Evaluation

## 2011-06-05 NOTE — Anesthesia Postprocedure Evaluation (Signed)
  Anesthesia Post-op Note  Patient: Ryan Valentine  Procedure(s) Performed:  LUMBAR LAMINECTOMY/DECOMPRESSION MICRODISCECTOMY - Right Lumbar five-Sacral one laminectomy/microdiscectomy  Patient Location: PACU  Anesthesia Type: General  Level of Consciousness: awake and oriented  Airway and Oxygen Therapy: Patient Spontanous Breathing  Post-op Pain: mild  Post-op Assessment: Post-op Vital signs reviewed, Patient's Cardiovascular Status Stable, Respiratory Function Stable, Patent Airway, No signs of Nausea or vomiting and Pain level controlled  Post-op Vital Signs: stable  Complications: No apparent anesthesia complications

## 2011-06-06 ENCOUNTER — Encounter (HOSPITAL_COMMUNITY): Payer: Self-pay | Admitting: Neurosurgery

## 2011-06-06 MED ORDER — OXYCODONE-ACETAMINOPHEN 5-325 MG PO TABS: 1.0000 | ORAL_TABLET | ORAL | Status: AC | PRN

## 2011-06-06 MED ORDER — CYCLOBENZAPRINE HCL 10 MG PO TABS: 10.0000 mg | ORAL_TABLET | Freq: Three times a day (TID) | ORAL | Status: AC | PRN

## 2011-06-06 NOTE — Discharge Summary (Signed)
Physician Discharge Summary  Patient ID: Ryan Valentine MRN: [id number] DOB/AGE: 08-29-1980 29 y.o.  Admit date: 06/05/2011 Discharge date: 06/06/2011  Admission Diagnoses:  Discharge Diagnoses:  Principal Problem:  *Lumbar herniated disc   Discharged Condition: good  Hospital Course: Admitted to the hospital where he underwent an uncomplicated right-sided L5-S1 laminotomy and microdiscectomy. Postoperative patient lower trimming pain has resolved. Still having back pain but this seems somewhat improved. Neurologically he is intact. Plan for discharge home. F2  Consults:   Significant Diagnostic Studies:   Treatments: Right L5-S1 laminotomy and microdiscectomy  Discharge Exam: Blood pressure 122/70, pulse 75, temperature 98.2 F (36.8 C), temperature source Oral, resp. rate 18, SpO2 93.00%. Patient awake and alert oriented appropriate cranial nerve function is intact. Motor and sensory function extremities is normal. Wound healing well. Chest and abdomen benign.  Disposition: Home or Self Care   Medication List  As of 06/06/2011 10:51 AM   STOP taking these medications         HYDROcodone-acetaminophen 10-325 MG per tablet         TAKE these medications         calcium carbonate 500 MG chewable tablet   Commonly known as: TUMS - dosed in mg elemental calcium   Chew 1 tablet by mouth daily as needed. Stomach upset      cyclobenzaprine 10 MG tablet   Commonly known as: FLEXERIL   Take 1 tablet (10 mg total) by mouth 3 (three) times daily as needed for muscle spasms.      ibuprofen 200 MG tablet   Commonly known as: ADVIL,MOTRIN   Take 800 mg by mouth every 8 (eight) hours as needed. For pain      methadone 10 MG tablet   Commonly known as: DOLOPHINE   Take 20 mg by mouth every 8 (eight) hours as needed. For pain      omeprazole 20 MG capsule   Commonly known as: PRILOSEC   Take 20 mg by mouth daily as needed. Acid reflux      oxyCODONE-acetaminophen 5-325 MG  per tablet   Commonly known as: PERCOCET   Take 1-2 tablets by mouth every 4 (four) hours as needed.           Follow-up Information    Follow up with Tarissa Kerin A, MD. Call in 1 week.   Contact information:   [address] [place] [address] [phone]          Signed: Temple Pacini 06/06/2011, 10:51 AM

## 2011-06-10 ENCOUNTER — Emergency Department (HOSPITAL_COMMUNITY)
Admission: EM | Admit: 2011-06-10 | Discharge: 2011-06-11 | Disposition: A | Discharge status: Home / Self Care | Payer: Medicaid Other | Attending: Emergency Medicine | Admitting: Emergency Medicine

## 2011-06-10 ENCOUNTER — Encounter (HOSPITAL_COMMUNITY): Payer: Self-pay | Admitting: *Deleted

## 2011-06-10 DIAGNOSIS — M549 Dorsalgia, unspecified: Secondary | ICD-10-CM | POA: Insufficient documentation

## 2011-06-10 DIAGNOSIS — K219 Gastro-esophageal reflux disease without esophagitis: Secondary | ICD-10-CM | POA: Insufficient documentation

## 2011-06-10 DIAGNOSIS — G709 Myoneural disorder, unspecified: Secondary | ICD-10-CM | POA: Insufficient documentation

## 2011-06-10 DIAGNOSIS — F17200 Nicotine dependence, unspecified, uncomplicated: Secondary | ICD-10-CM | POA: Insufficient documentation

## 2011-06-10 NOTE — ED Notes (Signed)
Had back surgery on Monday in [facility] via dr pool. Now has severe back pain and bleeding at surgical site

## 2011-06-10 NOTE — ED Notes (Signed)
Ran out of percocet yesterday

## 2011-06-11 ENCOUNTER — Encounter (HOSPITAL_COMMUNITY): Payer: Self-pay | Admitting: *Deleted

## 2011-06-11 ENCOUNTER — Emergency Department (HOSPITAL_COMMUNITY)
Admission: EM | Admit: 2011-06-11 | Discharge: 2011-06-11 | Disposition: A | Discharge status: Home / Self Care | Payer: BC Managed Care – PPO | Attending: Emergency Medicine | Admitting: Emergency Medicine

## 2011-06-11 DIAGNOSIS — F17200 Nicotine dependence, unspecified, uncomplicated: Secondary | ICD-10-CM | POA: Insufficient documentation

## 2011-06-11 DIAGNOSIS — M545 Low back pain, unspecified: Secondary | ICD-10-CM | POA: Insufficient documentation

## 2011-06-11 DIAGNOSIS — K219 Gastro-esophageal reflux disease without esophagitis: Secondary | ICD-10-CM | POA: Insufficient documentation

## 2011-06-11 DIAGNOSIS — G709 Myoneural disorder, unspecified: Secondary | ICD-10-CM | POA: Insufficient documentation

## 2011-06-11 MED ORDER — HYDROMORPHONE HCL PF 2 MG/ML IJ SOLN
2.0000 mg | Freq: Once | INTRAMUSCULAR | Status: AC
  Administered 2011-06-11: 2 mg via INTRAMUSCULAR
  Filled 2011-06-11: qty 1

## 2011-06-11 MED ORDER — HYDROMORPHONE HCL 4 MG PO TABS: ORAL_TABLET | ORAL | Status: DC

## 2011-06-11 MED ORDER — OXYCODONE-ACETAMINOPHEN 5-325 MG PO TABS: 2.0000 | ORAL_TABLET | ORAL | Status: AC | PRN

## 2011-06-11 MED ORDER — OXYCODONE-ACETAMINOPHEN 5-325 MG PO TABS
2.0000 | ORAL_TABLET | Freq: Once | ORAL | Status: AC
  Administered 2011-06-11: 2 via ORAL
  Filled 2011-06-11: qty 2

## 2011-06-11 NOTE — ED Notes (Signed)
Pt a/ox4. Resp even and unlabored. NAD at this time. D/C instructions reviewed with pt. Pt verbalized understanding. Pt ambulated to lobby with steady gate.  

## 2011-06-11 NOTE — ED Provider Notes (Signed)
History     CSN: [id number]  Arrival date & time 06/11/11  1314   First MD Initiated Contact with Patient 06/11/11 1353      Chief Complaint  Patient presents with  . Wound Infection    (Consider location/radiation/quality/duration/timing/severity/associated sxs/prior treatment) HPI Comments: Pt had surgery for lumbar disc 6 days ago by dr. Dutch Quint.  Has next appt in 3 days.  States dr. Dutch Quint had him on dilaudid 8 mg every 4 hrs and percocet the next 4 hrs..  He got percocet last PM here.  The history is provided by the patient. No language interpreter was used.    Past Medical History  Diagnosis Date  . Back pain   . Headache     occ headaches  . Neuromuscular disorder   . GERD (gastroesophageal reflux disease)     Past Surgical History  Procedure Date  . Knee arthroscopy   . Lumbar laminectomy/decompression microdiscectomy 06/05/2011    Procedure: LUMBAR LAMINECTOMY/DECOMPRESSION MICRODISCECTOMY;  Surgeon: Temple Pacini, MD;  Location: [facility];  Service: Neurosurgery;  Laterality: Right;  Right Lumbar five-Sacral one laminectomy/microdiscectomy    History reviewed. No pertinent family history.  History  Substance Use Topics  . Smoking status: Current Everyday Smoker -- 1.0 packs/day  . Smokeless tobacco: Not on file  . Alcohol Use: No      Review of Systems  Musculoskeletal: Positive for back pain.  All other systems reviewed and are negative.    Allergies  Ms contin; Penicillins; Tramadol; and Erythrocin  Home Medications   Current Outpatient Rx  Name Route Sig Dispense Refill  . CALCIUM CARBONATE ANTACID 500 MG PO CHEW Oral Chew 1 tablet by mouth daily as needed. Stomach upset    . CYCLOBENZAPRINE HCL 10 MG PO TABS Oral Take 1 tablet (10 mg total) by mouth 3 (three) times daily as needed for muscle spasms. 40 tablet 1  . HYDROMORPHONE HCL 4 MG PO TABS  One po q 6 hrs prn pain 20 tablet 0  . IBUPROFEN 200 MG PO TABS Oral Take 800 mg by mouth every 8  (eight) hours as needed. For pain    . METHADONE HCL 10 MG PO TABS Oral Take 20 mg by mouth every 8 (eight) hours as needed. For pain    . OMEPRAZOLE 20 MG PO CPDR Oral Take 20 mg by mouth daily as needed. Acid reflux    . OXYCODONE-ACETAMINOPHEN 5-325 MG PO TABS Oral Take 1-2 tablets by mouth every 4 (four) hours as needed. 80 tablet 0  . OXYCODONE-ACETAMINOPHEN 5-325 MG PO TABS Oral Take 2 tablets by mouth every 4 (four) hours as needed for pain. 30 tablet 0    BP 142/89  Pulse 117  Temp(Src) 98.2 F (36.8 C) (Oral)  Resp 18  Ht 6\' 4"  (1.93 m)  Wt 200 lb (90.719 kg)  BMI 24.34 kg/m2  SpO2 97%  Physical Exam  Nursing note and vitals reviewed. Constitutional: He is oriented to person, place, and time. He appears well-developed and well-nourished.  HENT:  Head: Normocephalic and atraumatic.  Eyes: EOM are normal.  Neck: Normal range of motion.  Cardiovascular: Normal rate, regular rhythm, normal heart sounds and intact distal pulses.   Pulmonary/Chest: Effort normal and breath sounds normal. No respiratory distress.  Abdominal: Soft. He exhibits no distension. There is no tenderness.  Musculoskeletal: Normal range of motion. He exhibits tenderness.       Arms: Neurological: He is alert and oriented to person, place, and  time.  Skin: Skin is warm and dry.  Psychiatric: He has a normal mood and affect. Judgment normal.    ED Course  Procedures (including critical care time)  Labs Reviewed - No data to display No results found.   No diagnosis found.    MDM          Worthy Rancher, PA 06/11/11 1619  Worthy Rancher, PA 06/11/11 [phone]

## 2011-06-11 NOTE — ED Notes (Signed)
Pt states he had surgery to his back and thinks it is infected. Pt was seen in ed last pm.

## 2011-06-11 NOTE — ED Provider Notes (Addendum)
History     CSN: [id number]  Arrival date & time 06/10/11  2318   First MD Initiated Contact with Patient 06/10/11 2348      Chief Complaint  Patient presents with  . Back Pain    (Consider location/radiation/quality/duration/timing/severity/associated sxs/prior treatment) HPI Ryan Valentine is a 31 y.o. male with a h/o back pain  S/p recent discectomy who presents to the Emergency Department complaining of back pain. He has run out of his medicines and is having pain at the site of the surgery.Denies fever, chills, numbness, tingling, loss of strength in his legs.  Neurosurgeon Dr. Jordan Likes Past Medical History  Diagnosis Date  . Back pain   . Headache     occ headaches  . Neuromuscular disorder   . GERD (gastroesophageal reflux disease)     Past Surgical History  Procedure Date  . Knee arthroscopy   . Lumbar laminectomy/decompression microdiscectomy 06/05/2011    Procedure: LUMBAR LAMINECTOMY/DECOMPRESSION MICRODISCECTOMY;  Surgeon: Temple Pacini, MD;  Location: [facility];  Service: Neurosurgery;  Laterality: Right;  Right Lumbar five-Sacral one laminectomy/microdiscectomy    History reviewed. No pertinent family history.  History  Substance Use Topics  . Smoking status: Current Everyday Smoker -- 1.0 packs/day  . Smokeless tobacco: Not on file  . Alcohol Use: No      Review of Systems 10 Systems reviewed and are negative for acute change except as noted in the HPI. Allergies  Ms contin; Penicillins; Tramadol; and Erythrocin  Home Medications   Current Outpatient Rx  Name Route Sig Dispense Refill  . CALCIUM CARBONATE ANTACID 500 MG PO CHEW Oral Chew 1 tablet by mouth daily as needed. Stomach upset    . CYCLOBENZAPRINE HCL 10 MG PO TABS Oral Take 1 tablet (10 mg total) by mouth 3 (three) times daily as needed for muscle spasms. 40 tablet 1  . IBUPROFEN 200 MG PO TABS Oral Take 800 mg by mouth every 8 (eight) hours as needed. For pain    . METHADONE HCL 10  MG PO TABS Oral Take 20 mg by mouth every 8 (eight) hours as needed. For pain    . OMEPRAZOLE 20 MG PO CPDR Oral Take 20 mg by mouth daily as needed. Acid reflux    . OXYCODONE-ACETAMINOPHEN 5-325 MG PO TABS Oral Take 1-2 tablets by mouth every 4 (four) hours as needed. 80 tablet 0    BP 134/108  Pulse 128  Temp(Src) 98.6 F (37 C) (Oral)  Resp 20  Ht 6\' 5"  (1.956 m)  Wt 200 lb (90.719 kg)  BMI 23.72 kg/m2  SpO2 100%  Physical Exam  Nursing note and vitals reviewed. Constitutional: He appears well-developed and well-nourished.  HENT:  Head: Normocephalic.  Right Ear: External ear normal.  Nose: Nose normal.  Mouth/Throat: Oropharynx is clear and moist.  Eyes: Conjunctivae and EOM are normal. Pupils are equal, round, and reactive to light.  Neck: Normal range of motion. Neck supple.  Cardiovascular: Normal rate, normal heart sounds and intact distal pulses.   Pulmonary/Chest: Effort normal and breath sounds normal.  Abdominal: Soft. Bowel sounds are normal.  Musculoskeletal:       Healed lower back incision with no evidence of bleeding or infection  Skin: Skin is warm and dry.    ED Course  Procedures (including critical care time)    MDM  Patient with h/o back pain s/p discectomy here with back pain. Given analgesics and Rx for additional analgesics.Pt stable in ED with no  significant deterioration in condition.The patient appears reasonably screened and/or stabilized for discharge and I doubt any other medical condition or other [facility] requiring further screening, evaluation, or treatment in the ED at this time prior to discharge.  MDM Reviewed: previous chart, nursing note and vitals           Nicoletta Dress. Colon Branch, MD 06/11/11 0021  Nicoletta Dress. Colon Branch, MD 07/04/11 1616

## 2011-06-11 NOTE — ED Notes (Signed)
Pt presents with swelling to area around surgical site on low back. Pt has surgery on Monday and is now having increased pain, swelling, and intermittent fever. Pt also notes pus draining from wound. No pus noted upon assessment.

## 2011-06-12 NOTE — ED Provider Notes (Signed)
Medical screening examination/treatment/procedure(s) were performed by non-physician practitioner and as supervising physician I was immediately available for consultation/collaboration.   Johnthomas Lader L Jaedin Regina, MD 06/12/11 1537 

## 2011-06-15 ENCOUNTER — Emergency Department (HOSPITAL_COMMUNITY)
Admission: EM | Admit: 2011-06-15 | Discharge: 2011-06-15 | Disposition: A | Discharge status: Home / Self Care | Payer: BC Managed Care – PPO | Attending: Emergency Medicine | Admitting: Emergency Medicine

## 2011-06-15 ENCOUNTER — Emergency Department (HOSPITAL_COMMUNITY): Payer: BC Managed Care – PPO

## 2011-06-15 ENCOUNTER — Encounter (HOSPITAL_COMMUNITY): Payer: Self-pay | Admitting: Emergency Medicine

## 2011-06-15 DIAGNOSIS — M545 Low back pain, unspecified: Secondary | ICD-10-CM | POA: Insufficient documentation

## 2011-06-15 DIAGNOSIS — R509 Fever, unspecified: Secondary | ICD-10-CM | POA: Insufficient documentation

## 2011-06-15 DIAGNOSIS — R209 Unspecified disturbances of skin sensation: Secondary | ICD-10-CM | POA: Insufficient documentation

## 2011-06-15 DIAGNOSIS — M549 Dorsalgia, unspecified: Secondary | ICD-10-CM

## 2011-06-15 DIAGNOSIS — R110 Nausea: Secondary | ICD-10-CM | POA: Insufficient documentation

## 2011-06-15 LAB — DIFFERENTIAL
Basophils Absolute: 0.1 10*3/uL (ref 0.0–0.1)
Basophils Relative: 1 % (ref 0–1)
Monocytes Relative: 6 % (ref 3–12)
Neutro Abs: 4.3 10*3/uL (ref 1.7–7.7)
Neutrophils Relative %: 57 % (ref 43–77)

## 2011-06-15 LAB — CBC
Hemoglobin: 13.8 g/dL (ref 13.0–17.0)
MCHC: 35.3 g/dL (ref 30.0–36.0)
Platelets: 264 10*3/uL (ref 150–400)

## 2011-06-15 MED ORDER — OXYCODONE-ACETAMINOPHEN 5-325 MG PO TABS
2.0000 | ORAL_TABLET | Freq: Once | ORAL | Status: AC
  Administered 2011-06-15: 2 via ORAL
  Filled 2011-06-15: qty 2

## 2011-06-15 NOTE — ED Notes (Signed)
Pt had surgery on lower back on Jun 05, 2011 for ruptured disc.  Pt states this am he bent over and the wound opened up.  States it bleed freely for 1.5 hours.  Pt also has pain.  No signs of current bleeding or open wound.

## 2011-06-15 NOTE — ED Provider Notes (Signed)
History   Scribed for Ryan Octave, MD, the patient was seen in [facility]. The chart was scribed by Gilman Schmidt. The patients care was started at 4:53 PM.   CSN: [id number]  Arrival date & time 06/15/11  1502   First MD Initiated Contact with Patient 06/15/11 1517      Chief Complaint  Patient presents with  . Back Pain  . Post-op Problem    (Consider location/radiation/quality/duration/timing/severity/associated sxs/prior treatment) HPI TRAVELL DESAULNIERS is a 31 y.o. male who presents to the Emergency Department complaining of back pain from post-op problem. Pt reports having surgery on lower back on Jun 05, 2011 for ruptured disc. States this am he bent over and the wound began to bleed for ~1.5 hours. Pt also notes pain.  Also reports fever of 100.3, numbness in legs, and nausea. Denies any vomiting. No current signs of bleeding or open wound. Pt notes he went to PCP yesterday for f/u. States he took last dose of meds this am and called PCP but was unable to be seen today. Pt has f/u appt in three weeks. There are no other associated symptoms and no other alleviating or aggravating factors.   Past Medical History  Diagnosis Date  . Back pain   . Headache     occ headaches  . Neuromuscular disorder   . GERD (gastroesophageal reflux disease)     Past Surgical History  Procedure Date  . Knee arthroscopy   . Lumbar laminectomy/decompression microdiscectomy 06/05/2011    Procedure: LUMBAR LAMINECTOMY/DECOMPRESSION MICRODISCECTOMY;  Surgeon: Temple Pacini, MD;  Location: [facility];  Service: Neurosurgery;  Laterality: Right;  Right Lumbar five-Sacral one laminectomy/microdiscectomy    History reviewed. No pertinent family history.  History  Substance Use Topics  . Smoking status: Current Everyday Smoker -- 1.0 packs/day  . Smokeless tobacco: Not on file  . Alcohol Use: 0.0 oz/week    0-1 Glasses of wine per week      Review of Systems  Constitutional: Positive for  fever.  Gastrointestinal: Positive for nausea. Negative for vomiting.  Musculoskeletal: Positive for back pain.  Skin: Positive for wound.  All other systems reviewed and are negative.    Allergies  Ms contin; Penicillins; Tramadol; Erythrocin; and Toradol  Home Medications   Current Outpatient Rx  Name Route Sig Dispense Refill  . CALCIUM CARBONATE ANTACID 500 MG PO CHEW Oral Chew 1 tablet by mouth daily as needed. Stomach upset    . CYCLOBENZAPRINE HCL 10 MG PO TABS Oral Take 1 tablet (10 mg total) by mouth 3 (three) times daily as needed for muscle spasms. 40 tablet 1  . HYDROCODONE-ACETAMINOPHEN 10-325 MG PO TABS Oral Take 1 tablet by mouth Every 4 hours as needed. For pain    . HYDROMORPHONE HCL 4 MG PO TABS  One po q 6 hrs prn pain 20 tablet 0  . IBUPROFEN 200 MG PO TABS Oral Take 800 mg by mouth every 8 (eight) hours as needed. For pain    . METHADONE HCL 10 MG PO TABS Oral Take 20 mg by mouth every 8 (eight) hours as needed. For pain    . OXYCODONE-ACETAMINOPHEN 5-325 MG PO TABS Oral Take 1-2 tablets by mouth every 4 (four) hours as needed. 80 tablet 0  . OXYCODONE-ACETAMINOPHEN 5-325 MG PO TABS Oral Take 2 tablets by mouth every 4 (four) hours as needed for pain. 30 tablet 0    BP 127/70  Pulse 95  Temp(Src) 97.8 F (36.6 C) (Oral)  Resp 18  Ht 6\' 5"  (1.956 m)  Wt 200 lb (90.719 kg)  BMI 23.72 kg/m2  SpO2 100%  Physical Exam  Nursing note and vitals reviewed. Constitutional: He is oriented to person, place, and time. He appears well-developed and well-nourished.  Non-toxic appearance. He does not have a sickly appearance.  HENT:  Head: Normocephalic and atraumatic.  Eyes: Conjunctivae, EOM and lids are normal. Pupils are equal, round, and reactive to light.  Neck: Trachea normal, normal range of motion and full passive range of motion without pain. Neck supple.  Cardiovascular: Regular rhythm and normal heart sounds.   Pulmonary/Chest: Effort normal and breath  sounds normal. No respiratory distress.  Abdominal: Soft. Normal appearance. He exhibits no distension. There is no tenderness. There is no rebound and no CVA tenderness.  Musculoskeletal: Normal range of motion.       5/5 strength  Dorsiflexion intact bilaterally   Neurological: He is alert and oriented to person, place, and time. He has normal strength.       +2 reflexes +2 DP/PT pulses  Skin: Skin is warm, dry and intact. No rash noted.       Midline lumbar incision shows no evidence of dehiscence  Mild surrounding erythema with tenderness    ED Course  Procedures (including critical care time)   Labs Reviewed  CBC  DIFFERENTIAL   Dg Lumbar Spine Complete  06/15/2011  *RADIOLOGY REPORT*  Clinical Data: 31 year old male with low back pain and fever - lumbar surgery 10 days ago.  LUMBAR SPINE - COMPLETE 4+ VIEW  Comparison: 07/22/2010  Findings: Five non-rib bearing lumbar type vertebra are again identified in normal alignment. There is no evidence of fracture, subluxation or dislocation. The disc spaces are unchanged and maintained. No definite soft tissue abnormalities are present. No focal bony lesions or spondylolysis noted.  IMPRESSION: Unremarkable lumbar spine series.  Original Report Authenticated By: Rosendo Gros, M.D.     1. Back pain     DIAGNOSTIC STUDIES: Oxygen Saturation is 100% on room air, normal by my interpretation.    COORDINATION OF CARE: 3:22pm:  - Patient evaluated by ED physician, Percocet, DG Lumbar Spine, CBC, Diff ordered. Consult to Neurosurgery.   Radiology: DG Lumbar Spine Complete. Reviewed by me. IMPRESSION: Unremarkable lumbar spine series. Original Report Authenticated By: Rosendo Gros, M.D.    MDM  S/p laminectomy 1/21 by Dr. Dutch Quint.  Presenting with pain to surgery site and states had bleeding from wound this morning.   No evidence of wound dehiscence, mild erythema, no drainage.  5/5 strength throughout, +2 patellar reflexes,  dorsiflexion of great toes intact bilaterally.  Seen in ED on 1/26 and given 30 vicodin, then again on 1/27 and given 20 dilaudid.  Saw Dr. Dutch Quint yesterday and got no prescriptions.  vicodin and dilaudid prescriptions not dispensed according to narcotic database but patient states he is out. Hemoglobin stable, no leukocytosis.  No findings on Xray.  I informed him I would not be prescribing additional narcotics.  I discussed with Dr. Dutch Quint who just saw the patient yesterday.  He states some mild swelling and erythema at his wound is expected.  There is no evidence of wound infection on Dr. Ethelene Browns exam yesterday or my exam today.  patient known narcotic abuser and has been selling prescriptions.  Dr. Dutch Quint is no longer prescribing him narcotics.    I personally performed the services described in this documentation, which was scribed in my presence.  The recorded information has  been reviewed and considered.      Ryan Octave, MD 06/15/11 [phone]

## 2011-06-15 NOTE — ED Notes (Signed)
Pt called out to desk, requested pain medicine "while he waits",  md notified, to room to discuss w/ pt.  Pt to be d/c to home.  Pt became anger when not given meds for pain, refused to sign for d/c papers, pt jumped out of bed, unaided and walked briskly out of er.

## 2011-06-15 NOTE — ED Notes (Signed)
Pt felt like he didn't need any xrays, He had just had them", after discussion agreed to xrays.  Took pain pills w/o difficulty

## 2011-06-15 NOTE — ED Notes (Signed)
Pt had back surgery 06/06/11, stated he bent over this am and wound opened and he began bleeding from site, wound appears to be closed, mild swelling noted.  Said he saw pmd yesterday and everything was fine.

## 2011-08-07 NOTE — ED Provider Notes (Signed)
History     CSN: [id number]  Arrival date & time 03/10/11  4401   First MD Initiated Contact with Patient 03/10/11 1123      Chief Complaint  Patient presents with  . Back Pain    (Consider location/radiation/quality/duration/timing/severity/associated sxs/prior treatment) HPI  Past Medical History  Diagnosis Date  . Back pain   . Headache     occ headaches  . Neuromuscular disorder   . GERD (gastroesophageal reflux disease)     Past Surgical History  Procedure Date  . Knee arthroscopy   . Lumbar laminectomy/decompression microdiscectomy 06/05/2011    Procedure: LUMBAR LAMINECTOMY/DECOMPRESSION MICRODISCECTOMY;  Surgeon: Temple Pacini, MD;  Location: [facility];  Service: Neurosurgery;  Laterality: Right;  Right Lumbar five-Sacral one laminectomy/microdiscectomy    History reviewed. No pertinent family history.  History  Substance Use Topics  . Smoking status: Current Everyday Smoker -- 1.0 packs/day  . Smokeless tobacco: Not on file  . Alcohol Use: 0.0 oz/week    0-1 Glasses of wine per week      Review of Systems  Allergies  Ms contin; Penicillins; Tramadol; Erythrocin; and Toradol  Home Medications   Current Outpatient Rx  Name Route Sig Dispense Refill  . CALCIUM CARBONATE ANTACID 500 MG PO CHEW Oral Chew 1 tablet by mouth daily as needed. Stomach upset    . HYDROCODONE-ACETAMINOPHEN 10-325 MG PO TABS Oral Take 1 tablet by mouth Every 4 hours as needed. For pain    . HYDROMORPHONE HCL 4 MG PO TABS  One po q 6 hrs prn pain 20 tablet 0  . IBUPROFEN 200 MG PO TABS Oral Take 800 mg by mouth every 8 (eight) hours as needed. For pain    . METHADONE HCL 10 MG PO TABS Oral Take 20 mg by mouth every 8 (eight) hours as needed. For pain      BP 119/69  Pulse 92  Temp(Src) 97.7 F (36.5 C) (Oral)  Resp 16  Ht 6\' 5"  (1.956 m)  Wt 200 lb (90.719 kg)  BMI 23.72 kg/m2  SpO2 100%  Physical Exam  ED Course  Procedures (including critical care time)  Labs  Reviewed - No data to display No results found.   1. Chronic back pain greater than 3 months duration       MDM  Pt was not seen by me.        Worthy Rancher, PA 08/14/11 1719

## 2011-08-14 NOTE — ED Provider Notes (Signed)
Medical screening examination/treatment/procedure(s) were performed by non-physician practitioner and as supervising physician I was immediately available for consultation/collaboration.   Laray Anger, DO 08/14/11 1734

## 2011-10-05 ENCOUNTER — Encounter (HOSPITAL_COMMUNITY): Payer: Self-pay | Admitting: *Deleted

## 2011-10-05 ENCOUNTER — Emergency Department (HOSPITAL_COMMUNITY)
Admission: EM | Admit: 2011-10-05 | Discharge: 2011-10-05 | Disposition: A | Discharge status: Home / Self Care | Payer: Self-pay | Attending: Emergency Medicine | Admitting: Emergency Medicine

## 2011-10-05 ENCOUNTER — Emergency Department (HOSPITAL_COMMUNITY): Payer: Self-pay

## 2011-10-05 DIAGNOSIS — R51 Headache: Secondary | ICD-10-CM | POA: Insufficient documentation

## 2011-10-05 DIAGNOSIS — F17200 Nicotine dependence, unspecified, uncomplicated: Secondary | ICD-10-CM | POA: Insufficient documentation

## 2011-10-05 MED ORDER — DEXAMETHASONE SODIUM PHOSPHATE 4 MG/ML IJ SOLN
10.0000 mg | Freq: Once | INTRAMUSCULAR | Status: AC
  Administered 2011-10-05: 10 mg via INTRAMUSCULAR
  Filled 2011-10-05: qty 3
  Filled 2011-10-05: qty 1

## 2011-10-05 MED ORDER — METOCLOPRAMIDE HCL 5 MG/ML IJ SOLN
10.0000 mg | Freq: Once | INTRAMUSCULAR | Status: AC
  Administered 2011-10-05: 10 mg via INTRAMUSCULAR
  Filled 2011-10-05: qty 2

## 2011-10-05 MED ORDER — DROPERIDOL 2.5 MG/ML IJ SOLN
1.2500 mg | Freq: Once | INTRAMUSCULAR | Status: AC
  Administered 2011-10-05: 1.25 mg via INTRAMUSCULAR
  Filled 2011-10-05: qty 2

## 2011-10-05 MED ORDER — DIPHENHYDRAMINE HCL 50 MG/ML IJ SOLN
25.0000 mg | Freq: Once | INTRAMUSCULAR | Status: AC
  Administered 2011-10-05: 19:00:00 via INTRAMUSCULAR
  Filled 2011-10-05: qty 1

## 2011-10-05 MED ORDER — KETOROLAC TROMETHAMINE 60 MG/2ML IM SOLN
60.0000 mg | Freq: Once | INTRAMUSCULAR | Status: DC
  Filled 2011-10-05: qty 2

## 2011-10-05 NOTE — Discharge Instructions (Signed)

## 2011-10-05 NOTE — ED Provider Notes (Signed)
History   This chart was scribed for Ryan Octave, MD by Brooks Sailors. The patient was seen in room APA10/APA10. Patient's care was started at 1714.   CSN: [id number]  Arrival date & time 10/05/11  1714   First MD Initiated Contact with Patient 10/05/11 1748      Chief Complaint  Patient presents with  . Headache    (Consider location/radiation/quality/duration/timing/severity/associated sxs/prior treatment) HPI  Ryan Valentine is a 31 y.o. male who presents to the Emergency Department complaining of moderate to severe intermittent headaches onset three weeks ago and worsening since onset. Pt says he started having headaches a few times a week and currently has them every day. Pt has associates symptoms of photophobia, dizziness, and nausea. Pt says sometimes he would sometimes have high blood pressure with the HA. Pt says the HA are of a gradual onset. Denies sudden onset.     Past Medical History  Diagnosis Date  . Back pain   . Headache     occ headaches  . Neuromuscular disorder   . GERD (gastroesophageal reflux disease)     Past Surgical History  Procedure Date  . Knee arthroscopy   . Lumbar laminectomy/decompression microdiscectomy 06/05/2011    Procedure: LUMBAR LAMINECTOMY/DECOMPRESSION MICRODISCECTOMY;  Surgeon: Temple Pacini, MD;  Location: [facility];  Service: Neurosurgery;  Laterality: Right;  Right Lumbar five-Sacral one laminectomy/microdiscectomy    Family History  Problem Relation Age of Onset  . Diabetes Mother   . Hypertension Mother   . Diabetes Father     History  Substance Use Topics  . Smoking status: Current Everyday Smoker -- 1.0 packs/day  . Smokeless tobacco: Not on file  . Alcohol Use: 0.0 oz/week    0-1 Glasses of wine per week      Review of Systems  All other systems reviewed and are negative.    Allergies  Ms contin; Penicillins; Tramadol; Erythrocin; and Ketorolac tromethamine  Home Medications   Current  Outpatient Rx  Name Route Sig Dispense Refill  . CALCIUM CARBONATE ANTACID 500 MG PO CHEW Oral Chew 1 tablet by mouth daily as needed. Stomach upset    . HYDROCODONE-ACETAMINOPHEN 10-325 MG PO TABS Oral Take 1 tablet by mouth Every 4 hours as needed. For pain    . HYDROMORPHONE HCL 4 MG PO TABS  One po q 6 hrs prn pain 20 tablet 0  . IBUPROFEN 200 MG PO TABS Oral Take 800 mg by mouth every 8 (eight) hours as needed. For pain    . METHADONE HCL 10 MG PO TABS Oral Take 20 mg by mouth every 8 (eight) hours as needed. For pain      BP 164/86  Pulse 79  Temp(Src) 97.9 F (36.6 C) (Oral)  Resp 20  Ht 6\' 5"  (1.956 m)  Wt 205 lb (92.987 kg)  BMI 24.31 kg/m2  SpO2 98%  Physical Exam  Nursing note and vitals reviewed. Constitutional: He is oriented to person, place, and time. He appears well-developed and well-nourished.  HENT:  Head: Normocephalic and atraumatic.  Eyes: Conjunctivae and EOM are normal. Pupils are equal, round, and reactive to light.  Neck: Normal range of motion. Neck supple.  Cardiovascular: Normal rate and regular rhythm.   Pulmonary/Chest: Effort normal and breath sounds normal.  Abdominal: Soft. Bowel sounds are normal.  Musculoskeletal: Normal range of motion.  Neurological: He is alert and oriented to person, place, and time.       Cranial nerves 3-12 intact,  5/5 strength. No ataxia. No meningismus. No thunderclap onset.   Skin: Skin is warm and dry.  Psychiatric: He has a normal mood and affect.    ED Course  Procedures (including critical care time)  1800 Patient informed of current plan for treatment and evaluation and agrees with plan at this time. Pt to have CT of the head.   2033 saw patient, wants to head home says pain is not significantly better  Labs Reviewed - No data to display No results found.   No diagnosis found.    MDM  Gradual onset diffuse headaches that have been intermittent for the past 3 weeks.  Associated with nausea,  photophobia, lightheadness and dizziness. No chest pain, SOB.  No history of migraines.  Neuro intact, no meningismus.  Not thunderclap onset. As patient has no history of migraines, planned to obtain CT which patient refused. Reports no relief with headache cocktail including: benadryl, reglan, droperidol, decadron (refused toradol).  Patient upset and threatening to leave if he "doesn't get something for pain".  I informed the patient that migraine headaches are not treated with narcotics.  At that point, he demanded to be discharged and refused further treatment. No neuro deficits, no headache red flags.  Notably, patient is known narcotic abuser. (see my note January 2013 and discussion with Dr. Jordan Likes).    I personally performed the services described in this documentation, which was scribed in my presence.  The recorded information has been reviewed and considered.    Ryan Octave, MD 10/06/11 2026

## 2011-10-05 NOTE — ED Notes (Signed)
Headache, dizzy, intermittently for 3 weeks.

## 2011-11-12 ENCOUNTER — Emergency Department (HOSPITAL_COMMUNITY)
Admission: EM | Admit: 2011-11-12 | Discharge: 2011-11-12 | Disposition: A | Discharge status: Home / Self Care | Payer: Self-pay | Attending: Emergency Medicine | Admitting: Emergency Medicine

## 2011-11-12 ENCOUNTER — Encounter (HOSPITAL_COMMUNITY): Payer: Self-pay | Admitting: *Deleted

## 2011-11-12 DIAGNOSIS — K089 Disorder of teeth and supporting structures, unspecified: Secondary | ICD-10-CM | POA: Insufficient documentation

## 2011-11-12 DIAGNOSIS — R220 Localized swelling, mass and lump, head: Secondary | ICD-10-CM | POA: Insufficient documentation

## 2011-11-12 DIAGNOSIS — K029 Dental caries, unspecified: Secondary | ICD-10-CM | POA: Insufficient documentation

## 2011-11-12 DIAGNOSIS — K0889 Other specified disorders of teeth and supporting structures: Secondary | ICD-10-CM

## 2011-11-12 MED ORDER — CLINDAMYCIN HCL 150 MG PO CAPS: ORAL_CAPSULE | ORAL | Status: DC

## 2011-11-12 MED ORDER — HYDROCODONE-ACETAMINOPHEN 5-325 MG PO TABS: ORAL_TABLET | ORAL | Status: DC

## 2011-11-12 NOTE — ED Provider Notes (Signed)
History     CSN: [id number]  Arrival date & time 11/12/11  1132   First MD Initiated Contact with Patient 11/12/11 1323      Chief Complaint  Patient presents with  . Dental Pain    (Consider location/radiation/quality/duration/timing/severity/associated sxs/prior treatment) Patient is a 31 y.o. male presenting with tooth pain. The history is provided by the patient.  Dental PainThe primary symptoms include mouth pain. The symptoms began yesterday. The symptoms are worsening. The symptoms occur constantly.  Additional symptoms include: dental sensitivity to temperature, gum swelling and jaw pain. Medical issues include: smoking.    Past Medical History  Diagnosis Date  . Back pain   . Headache     occ headaches  . Neuromuscular disorder   . GERD (gastroesophageal reflux disease)     Past Surgical History  Procedure Date  . Knee arthroscopy   . Lumbar laminectomy/decompression microdiscectomy 06/05/2011    Procedure: LUMBAR LAMINECTOMY/DECOMPRESSION MICRODISCECTOMY;  Surgeon: Temple Pacini, MD;  Location: [facility];  Service: Neurosurgery;  Laterality: Right;  Right Lumbar five-Sacral one laminectomy/microdiscectomy    Family History  Problem Relation Age of Onset  . Diabetes Mother   . Hypertension Mother   . Diabetes Father     History  Substance Use Topics  . Smoking status: Current Everyday Smoker -- 1.0 packs/day  . Smokeless tobacco: Not on file  . Alcohol Use: No      Review of Systems  Allergies  Ms contin; Penicillins; Tramadol; Erythrocin; and Ketorolac tromethamine  Home Medications   Current Outpatient Rx  Name Route Sig Dispense Refill  . ASPIRIN-ACETAMINOPHEN-CAFFEINE 250-250-65 MG PO TABS Oral Take 2 tablets by mouth as needed. For migraine/headache pain    . CALCIUM CARBONATE ANTACID 500 MG PO CHEW Oral Chew 1 tablet by mouth daily as needed. Stomach upset    . IBUPROFEN 200 MG PO TABS Oral Take 800 mg by mouth every 8 (eight) hours as  needed. For pain      BP 133/90  Pulse 77  Temp 98.5 F (36.9 C)  Resp 20  Ht 6\' 5"  (1.956 m)  Wt 200 lb (90.719 kg)  BMI 23.72 kg/m2  SpO2 97%  Physical Exam  Nursing note and vitals reviewed. Constitutional: He is oriented to person, place, and time. He appears well-developed and well-nourished.  Non-toxic appearance.  HENT:  Head: Normocephalic.  Right Ear: Tympanic membrane and external ear normal.  Left Ear: Tympanic membrane and external ear normal.       Cavity of the left upper 1st molar. Mild to mod swelling of the gum. NO visible abscess. Airway patent.  Eyes: EOM and lids are normal. Pupils are equal, round, and reactive to light.  Neck: Normal range of motion. Neck supple. Carotid bruit is not present.  Cardiovascular: Normal rate, regular rhythm, normal heart sounds, intact distal pulses and normal pulses.   Pulmonary/Chest: Breath sounds normal. No respiratory distress.  Abdominal: Soft. Bowel sounds are normal. There is no tenderness. There is no guarding.  Musculoskeletal: Normal range of motion.  Lymphadenopathy:       Head (right side): No submandibular adenopathy present.       Head (left side): No submandibular adenopathy present.    He has no cervical adenopathy.  Neurological: He is alert and oriented to person, place, and time. He has normal strength. No cranial nerve deficit or sensory deficit.  Skin: Skin is warm and dry.  Psychiatric: He has a normal mood and affect. His  speech is normal.    ED Course  Procedures (including critical care time)  Labs Reviewed - No data to display No results found.   No diagnosis found.    MDM  I have reviewed nursing notes, vital signs, and all appropriate lab and imaging results for this patient. Pt has a cavity of the left upper 1st molar. Dental evaluation strongly suggested. Will start clindaymcin  For infection. Rx for norco#12 given to the patient. Pt to see his dentist on July2.       Kathie Dike, PA 11/12/11 [address] [place], [place] 11/12/11 [phone]

## 2011-11-12 NOTE — Discharge Instructions (Signed)
Toothache Toothaches are usually caused by tooth decay (cavity). However, other causes of toothache include:  Gum disease.   Cracked tooth.   Cracked filling.   Injury.   Jaw problem (temporo mandibular joint or TMJ disorder).   Tooth abscess.   Root sensitivity.   Grinding.   Eruption problems.  Swelling and redness around a painful tooth often means you have a dental abscess. Pain medicine and antibiotics can help reduce symptoms, but you will need to see a dentist within the next few days to have your problem properly evaluated and treated. If tooth decay is the problem, you may need a filling or root canal to save your tooth. If the problem is more severe, your tooth may need to be pulled. SEEK IMMEDIATE MEDICAL CARE IF:  You cannot swallow.   You develop severe swelling, increased redness, or increased pain in your mouth or face.   You have a fever.   You cannot open your mouth adequately.  Document Released: 06/08/2004 Document Revised: 04/20/2011 Document Reviewed: 07/29/2009 [employer] Patient Information 2012 [employer]. 

## 2011-11-12 NOTE — ED Notes (Signed)
Dental pain that started last pm, pt thinks that he may have broken a tooth

## 2011-11-12 NOTE — ED Notes (Signed)
See triage note.

## 2011-11-12 NOTE — ED Provider Notes (Signed)
Medical screening examination/treatment/procedure(s) were performed by non-physician practitioner and as supervising physician I was immediately available for consultation/collaboration. Devoria Albe, MD, Armando Gang   Ward Givens, MD 11/12/11 [phone]

## 2011-12-01 ENCOUNTER — Emergency Department (HOSPITAL_COMMUNITY)
Admission: EM | Admit: 2011-12-01 | Discharge: 2011-12-01 | Disposition: A | Discharge status: Home / Self Care | Payer: PRIVATE HEALTH INSURANCE | Attending: Emergency Medicine | Admitting: Emergency Medicine

## 2011-12-01 ENCOUNTER — Encounter (HOSPITAL_COMMUNITY): Payer: Self-pay | Admitting: *Deleted

## 2011-12-01 DIAGNOSIS — K0889 Other specified disorders of teeth and supporting structures: Secondary | ICD-10-CM

## 2011-12-01 DIAGNOSIS — K029 Dental caries, unspecified: Secondary | ICD-10-CM | POA: Insufficient documentation

## 2011-12-01 DIAGNOSIS — K219 Gastro-esophageal reflux disease without esophagitis: Secondary | ICD-10-CM | POA: Insufficient documentation

## 2011-12-01 DIAGNOSIS — Z880 Allergy status to penicillin: Secondary | ICD-10-CM | POA: Insufficient documentation

## 2011-12-01 DIAGNOSIS — F17200 Nicotine dependence, unspecified, uncomplicated: Secondary | ICD-10-CM | POA: Insufficient documentation

## 2011-12-01 MED ORDER — HYDROCODONE-ACETAMINOPHEN 5-325 MG PO TABS: ORAL_TABLET | ORAL | Status: AC

## 2011-12-01 NOTE — ED Notes (Signed)
Dental pain, filling fell out

## 2011-12-01 NOTE — ED Provider Notes (Signed)
History     CSN: [id number]  Arrival date & time 12/01/11  2003   First MD Initiated Contact with Patient 12/01/11 2026      Chief Complaint  Patient presents with  . Dental Pain    (Consider location/radiation/quality/duration/timing/severity/associated sxs/prior treatment) Patient is a 31 y.o. male presenting with tooth pain. The history is provided by the patient.  Dental PainThe primary symptoms include mouth pain. Primary symptoms do not include oral bleeding, headaches, fever, shortness of breath, sore throat, angioedema or cough. The symptoms began 2 days ago. The symptoms are unchanged. The symptoms are new. The symptoms occur constantly.  Mouth pain began 24 -48 hours ago. Mouth pain occurs constantly. Mouth pain is unchanged. Affected locations include: teeth.  Additional symptoms include: dental sensitivity to temperature. Additional symptoms do not include: gum swelling, gum tenderness, trismus, facial swelling, trouble swallowing and swollen glands. Medical issues include: smoking and periodontal disease.    Past Medical History  Diagnosis Date  . Back pain   . Headache     occ headaches  . GERD (gastroesophageal reflux disease)     Past Surgical History  Procedure Date  . Knee arthroscopy   . Lumbar laminectomy/decompression microdiscectomy 06/05/2011    Procedure: LUMBAR LAMINECTOMY/DECOMPRESSION MICRODISCECTOMY;  Surgeon: Temple Pacini, MD;  Location: [facility];  Service: Neurosurgery;  Laterality: Right;  Right Lumbar five-Sacral one laminectomy/microdiscectomy  . Back surgery     Family History  Problem Relation Age of Onset  . Diabetes Mother   . Hypertension Mother   . Diabetes Father     History  Substance Use Topics  . Smoking status: Current Everyday Smoker -- 1.0 packs/day  . Smokeless tobacco: Not on file  . Alcohol Use: No      Review of Systems  Constitutional: Negative for fever and appetite change.  HENT: Positive for dental  problem. Negative for congestion, sore throat, facial swelling, trouble swallowing, neck pain and neck stiffness.   Eyes: Negative for pain and visual disturbance.  Respiratory: Negative for cough and shortness of breath.   Neurological: Negative for dizziness, facial asymmetry and headaches.  Hematological: Negative for adenopathy.  All other systems reviewed and are negative.    Allergies  Ms contin; Penicillins; Tramadol; Erythrocin; and Ketorolac tromethamine  Home Medications   Current Outpatient Rx  Name Route Sig Dispense Refill  . ASPIRIN-ACETAMINOPHEN-CAFFEINE 250-250-65 MG PO TABS Oral Take 2 tablets by mouth as needed. For migraine/headache pain    . CALCIUM CARBONATE ANTACID 500 MG PO CHEW Oral Chew 1 tablet by mouth daily as needed. Stomach upset    . CLINDAMYCIN HCL 150 MG PO CAPS  1 po tid with food 21 capsule 0  . HYDROCODONE-ACETAMINOPHEN 5-325 MG PO TABS  1 po q4h prn pain 12 tablet 0  . IBUPROFEN 200 MG PO TABS Oral Take 800 mg by mouth every 8 (eight) hours as needed. For pain      BP 102/80  Pulse 79  Temp 98.2 F (36.8 C) (Oral)  Resp 20  Ht 6\' 5"  (1.956 m)  Wt 200 lb (90.719 kg)  BMI 23.72 kg/m2  SpO2 98%  Physical Exam  Nursing note and vitals reviewed. Constitutional: He is oriented to person, place, and time. He appears well-developed and well-nourished. No distress.  HENT:  Head: Normocephalic and atraumatic. No trismus in the jaw.  Mouth/Throat: Uvula is midline, oropharynx is clear and moist and mucous membranes are normal. Dental caries present. No dental abscesses or uvula  swelling.         partially avulsed tooth.  No swelling of the gums or abscess  Neck: Normal range of motion. Neck supple.  Cardiovascular: Normal rate, regular rhythm and normal heart sounds.   No murmur heard. Pulmonary/Chest: Effort normal and breath sounds normal.  Musculoskeletal: Normal range of motion.  Lymphadenopathy:    He has no cervical adenopathy.    Neurological: He is alert and oriented to person, place, and time. He exhibits normal muscle tone. Coordination normal.  Skin: Skin is warm and dry.    ED Course  Procedures (including critical care time)  Labs Reviewed - No data to display      MDM     The patient appears reasonably screened and/or stabilized for discharge and I doubt any other medical condition or other [facility] requiring further screening, evaluation, or treatment in the ED at this time prior to discharge.   Prescribed:  Norco #20         Caswell Alvillar L. Math [place], [place] 12/03/11 1234

## 2011-12-04 NOTE — ED Provider Notes (Signed)
Medical screening examination/treatment/procedure(s) were performed by non-physician practitioner and as supervising physician I was immediately available for consultation/collaboration.  Flint Melter, MD 12/04/11 1850

## 2011-12-14 ENCOUNTER — Emergency Department (HOSPITAL_COMMUNITY)
Admission: EM | Admit: 2011-12-14 | Discharge: 2011-12-14 | Disposition: A | Discharge status: Home / Self Care | Payer: Self-pay | Attending: Emergency Medicine | Admitting: Emergency Medicine

## 2011-12-14 ENCOUNTER — Encounter (HOSPITAL_COMMUNITY): Payer: Self-pay | Admitting: *Deleted

## 2011-12-14 ENCOUNTER — Emergency Department (HOSPITAL_COMMUNITY): Payer: Self-pay

## 2011-12-14 DIAGNOSIS — M533 Sacrococcygeal disorders, not elsewhere classified: Secondary | ICD-10-CM | POA: Insufficient documentation

## 2011-12-14 DIAGNOSIS — K219 Gastro-esophageal reflux disease without esophagitis: Secondary | ICD-10-CM | POA: Insufficient documentation

## 2011-12-14 DIAGNOSIS — F17200 Nicotine dependence, unspecified, uncomplicated: Secondary | ICD-10-CM | POA: Insufficient documentation

## 2011-12-14 MED ORDER — HYDROCODONE-ACETAMINOPHEN 5-325 MG PO TABS: 1.0000 | ORAL_TABLET | Freq: Four times a day (QID) | ORAL | Status: AC | PRN

## 2011-12-14 MED ORDER — HYDROCODONE-ACETAMINOPHEN 5-325 MG PO TABS
1.0000 | ORAL_TABLET | Freq: Once | ORAL | Status: AC
  Administered 2011-12-14: 1 via ORAL
  Filled 2011-12-14: qty 1

## 2011-12-14 NOTE — ED Notes (Signed)
Pt reports having pain "at my tailbone" for 3-4 days, was on trampoline is only possible injury, describes as "having a hole in it"

## 2011-12-14 NOTE — ED Provider Notes (Signed)
History     CSN: [id number]  Arrival date & time 12/14/11  1503   First MD Initiated Contact with Patient 12/14/11 1605      Chief Complaint  Patient presents with  . Tailbone Pain    (Consider location/radiation/quality/duration/timing/severity/associated sxs/prior treatment) HPI Comments: States he was bouncing on a trampoline 3-4 days ago with his children and struck his tailbone on a metal structure.  "i have an appt with [facility]".  Pt is here frequently with chronic back pain complaints.  No radiation   The history is provided by the patient. No language interpreter was used.    Past Medical History  Diagnosis Date  . Back pain   . Headache     occ headaches  . GERD (gastroesophageal reflux disease)     Past Surgical History  Procedure Date  . Knee arthroscopy   . Lumbar laminectomy/decompression microdiscectomy 06/05/2011    Procedure: LUMBAR LAMINECTOMY/DECOMPRESSION MICRODISCECTOMY;  Surgeon: Temple Pacini, MD;  Location: [facility];  Service: Neurosurgery;  Laterality: Right;  Right Lumbar five-Sacral one laminectomy/microdiscectomy  . Back surgery     Family History  Problem Relation Age of Onset  . Diabetes Mother   . Hypertension Mother   . Diabetes Father     History  Substance Use Topics  . Smoking status: Current Everyday Smoker -- 1.0 packs/day  . Smokeless tobacco: Not on file  . Alcohol Use: No      Review of Systems  Musculoskeletal: Positive for back pain.  Neurological: Negative for weakness and numbness.    Allergies  Ms contin; Penicillins; Tramadol; Erythrocin; and Ketorolac tromethamine  Home Medications   Current Outpatient Rx  Name Route Sig Dispense Refill  . ASPIRIN-ACETAMINOPHEN-CAFFEINE 250-250-65 MG PO TABS Oral Take 2 tablets by mouth as needed. For migraine/headache pain    . IBUPROFEN 200 MG PO TABS Oral Take 800 mg by mouth every 8 (eight) hours as needed. For pain    . HYDROCODONE-ACETAMINOPHEN 5-325 MG PO  TABS Oral Take 1 tablet by mouth every 6 (six) hours as needed for pain. 10 tablet 0    BP 123/70  Pulse 72  Temp 97.7 F (36.5 C) (Oral)  Resp 20  Ht 6\' 5"  (1.956 m)  Wt 200 lb (90.719 kg)  BMI 23.72 kg/m2  SpO2 100%  Physical Exam  Nursing note and vitals reviewed. Constitutional: He is oriented to person, place, and time. He appears well-developed and well-nourished.  HENT:  Head: Normocephalic and atraumatic.  Eyes: EOM are normal.  Neck: Normal range of motion.  Cardiovascular: Normal rate, regular rhythm, normal heart sounds and intact distal pulses.   Pulmonary/Chest: Effort normal and breath sounds normal. No respiratory distress.  Abdominal: Soft. He exhibits no distension. There is no tenderness.  Musculoskeletal: He exhibits tenderness.       Lumbar back: He exhibits decreased range of motion, bony tenderness and pain. He exhibits no swelling, no edema, no deformity, no laceration, no spasm and normal pulse.       Back:  Neurological: He is alert and oriented to person, place, and time.  Skin: Skin is warm and dry.  Psychiatric: He has a normal mood and affect. Judgment normal.    ED Course  Procedures (including critical care time)  Labs Reviewed - No data to display Dg Sacrum/coccyx  12/14/2011  *RADIOLOGY REPORT*  Clinical Data: Tail bone pain for 3 days.  SACRUM AND COCCYX - 2+ VIEW  Comparison: None.  Findings: No sacral  or coccygeal fracture is identified.  Sacral arcades appear intact.  IMPRESSION: Negative.  Original Report Authenticated By: Andreas Newport, M.D.     1. Coccyx pain       MDM  Hydrocodone, 10 Ibuprofen Ice F/u with [facility] as planned.        Evalina Field, [place] 12/14/11 1733

## 2011-12-14 NOTE — ED Notes (Signed)
Pain in tailbone for 3-4 days, denies any injury

## 2011-12-19 NOTE — ED Provider Notes (Signed)
Medical screening examination/treatment/procedure(s) were performed by non-physician practitioner and as supervising physician I was immediately available for consultation/collaboration.  Pietra Zuluaga T Nicholi Ghuman, MD 12/19/11 0832 

## 2012-01-11 ENCOUNTER — Encounter (HOSPITAL_COMMUNITY): Payer: Self-pay | Admitting: Emergency Medicine

## 2012-01-11 ENCOUNTER — Emergency Department (HOSPITAL_COMMUNITY)
Admission: EM | Admit: 2012-01-11 | Discharge: 2012-01-11 | Disposition: A | Discharge status: Home / Self Care | Payer: Self-pay | Attending: Emergency Medicine | Admitting: Emergency Medicine

## 2012-01-11 DIAGNOSIS — K219 Gastro-esophageal reflux disease without esophagitis: Secondary | ICD-10-CM | POA: Insufficient documentation

## 2012-01-11 DIAGNOSIS — K0889 Other specified disorders of teeth and supporting structures: Secondary | ICD-10-CM

## 2012-01-11 DIAGNOSIS — F17200 Nicotine dependence, unspecified, uncomplicated: Secondary | ICD-10-CM | POA: Insufficient documentation

## 2012-01-11 DIAGNOSIS — K029 Dental caries, unspecified: Secondary | ICD-10-CM | POA: Insufficient documentation

## 2012-01-11 MED ORDER — OXYCODONE-ACETAMINOPHEN 5-325 MG PO TABS: ORAL_TABLET | ORAL | Status: AC

## 2012-01-11 MED ORDER — IBUPROFEN 400 MG PO TABS
400.0000 mg | ORAL_TABLET | Freq: Once | ORAL | Status: AC
  Administered 2012-01-11: 400 mg via ORAL
  Filled 2012-01-11: qty 1

## 2012-01-11 MED ORDER — NAPROXEN 250 MG PO TABS: 250.0000 mg | ORAL_TABLET | Freq: Two times a day (BID) | ORAL | Status: DC

## 2012-01-11 MED ORDER — CLINDAMYCIN HCL 150 MG PO CAPS: 150.0000 mg | ORAL_CAPSULE | Freq: Four times a day (QID) | ORAL | Status: AC

## 2012-01-11 MED ORDER — OXYCODONE-ACETAMINOPHEN 5-325 MG PO TABS
1.0000 | ORAL_TABLET | Freq: Once | ORAL | Status: AC
  Administered 2012-01-11: 1 via ORAL
  Filled 2012-01-11: qty 1

## 2012-01-11 NOTE — ED Notes (Signed)
Patient complaining of left upper dental pain that has been bothering him for a while.

## 2012-01-11 NOTE — ED Notes (Signed)
Patient with no complaints at this time. Respirations even and unlabored. Skin warm/dry. Discharge instructions reviewed with patient at this time. Patient given opportunity to voice concerns/ask questions. Patient discharged at this time and left Emergency Department with steady gait.   

## 2012-01-11 NOTE — ED Provider Notes (Signed)
History     CSN: [id number]  Arrival date & time 01/11/12  2038   First MD Initiated Contact with Patient 01/11/12 2038      Chief Complaint  Patient presents with  . Dental Pain    HPI Pt was seen at 2100.  Per pt, c/o gradual onset and persistence of constant left upper tooth "pain" for the past several months, worse over the past several days.  Denies fevers, no intra-oral edema, no rash, no facial swelling, no dysphagia, no neck pain.   The condition is aggravated by nothing. The condition is relieved by nothing. The symptoms have been associated with no other complaints. The patient has no significant history of serious medical conditions.     Past Medical History  Diagnosis Date  . Back pain   . Headache     occ headaches  . GERD (gastroesophageal reflux disease)     Past Surgical History  Procedure Date  . Knee arthroscopy   . Lumbar laminectomy/decompression microdiscectomy 06/05/2011    Procedure: LUMBAR LAMINECTOMY/DECOMPRESSION MICRODISCECTOMY;  Surgeon: Temple Pacini, MD;  Location: [facility];  Service: Neurosurgery;  Laterality: Right;  Right Lumbar five-Sacral one laminectomy/microdiscectomy  . Back surgery     Family History  Problem Relation Age of Onset  . Diabetes Mother   . Hypertension Mother   . Diabetes Father     History  Substance Use Topics  . Smoking status: Current Everyday Smoker -- 1.0 packs/day  . Smokeless tobacco: Not on file  . Alcohol Use: No      Review of Systems ROS: Statement: All systems negative except as marked or noted in the HPI; Constitutional: Negative for fever and chills. ; ; Eyes: Negative for eye pain and discharge. ; ; ENMT: Positive for dental caries, dental hygiene poor and toothache. Negative for ear pain, bleeding gums, dental injury, facial deformity, facial swelling, hoarseness, nasal congestion, sinus pressure, sore throat, throat swelling and tongue swollen. ; ; Cardiovascular: Negative for chest pain,  palpitations, diaphoresis, dyspnea and peripheral edema. ; ; Respiratory: Negative for cough, wheezing and stridor. ; ; Gastrointestinal: Negative for nausea, vomiting, diarrhea and abdominal pain. ; ; Genitourinary: Negative for dysuria, flank pain and hematuria. ; ; Musculoskeletal: Negative for back pain and neck pain. ; ; Skin: Negative for rash and skin lesion. ; ; Neuro: Negative for headache, lightheadedness and neck stiffness. ;    Allergies  Ms contin; Penicillins; Tramadol; Erythrocin; and Ketorolac tromethamine  Home Medications   Current Outpatient Rx  Name Route Sig Dispense Refill  . ASPIRIN-ACETAMINOPHEN-CAFFEINE 250-250-65 MG PO TABS Oral Take 2 tablets by mouth as needed. For migraine/headache pain    . CLINDAMYCIN HCL 150 MG PO CAPS Oral Take 1 capsule (150 mg total) by mouth every 6 (six) hours. 20 capsule 0  . IBUPROFEN 200 MG PO TABS Oral Take 800 mg by mouth every 8 (eight) hours as needed. For pain    . NAPROXEN 250 MG PO TABS Oral Take 1 tablet (250 mg total) by mouth 2 (two) times daily with a meal. 14 tablet 0  . OXYCODONE-ACETAMINOPHEN 5-325 MG PO TABS  1 or 2 tabs PO q6h prn pain 20 tablet 0    BP 124/63  Pulse 65  Temp 97.6 F (36.4 C) (Oral)  Resp 18  Ht 6\' 5"  (1.956 m)  Wt 205 lb (92.987 kg)  BMI 24.31 kg/m2  SpO2 99%  Physical Exam 2105: Physical examination: Vital signs and O2 SAT: Reviewed; Constitutional:  Well developed, Well nourished, Well hydrated, In no acute distress; Head and Face: Normocephalic, Atraumatic; Eyes: EOMI, PERRL, No scleral icterus; ENMT: Mouth and pharynx normal, Poor dentition, Widespread dental decay, Left TM normal, Right TM normal, Mucous membranes moist, +upper left 2nd molar with dental decay.  No gingival erythema, edema, fluctuance, or drainage.  No hoarse voice, no drooling, no stridor.  ; Neck: Supple, Full range of motion, No lymphadenopathy; Cardiovascular: Regular rate and rhythm, No murmur, rub, or gallop; Respiratory:  Breath sounds clear & equal bilaterally, No rales, rhonchi, wheezes, or rub, Normal respiratory effort/excursion; Chest: Nontender, Movement normal; Extremities: Pulses normal, No tenderness, No edema; Neuro: AA&Ox3, Major CN grossly intact.  No gross focal motor or sensory deficits in extremities.; Skin: Color normal, No rash, No petechiae, Warm, Dry   ED Course  Procedures    MDM  MDM Reviewed: nursing note, previous chart and vitals      2115:  Pt encouraged to f/u with dentist or oral surgeon for his dental needs for good continuity of care and definitive treatment.  Verb understanding.        Laray Anger, DO 01/12/12 1011

## 2012-02-05 ENCOUNTER — Emergency Department (HOSPITAL_COMMUNITY)
Admission: EM | Admit: 2012-02-05 | Discharge: 2012-02-05 | Disposition: A | Discharge status: Home / Self Care | Payer: Self-pay | Attending: Emergency Medicine | Admitting: Emergency Medicine

## 2012-02-05 ENCOUNTER — Encounter (HOSPITAL_COMMUNITY): Payer: Self-pay | Admitting: Emergency Medicine

## 2012-02-05 DIAGNOSIS — Z888 Allergy status to other drugs, medicaments and biological substances status: Secondary | ICD-10-CM | POA: Insufficient documentation

## 2012-02-05 DIAGNOSIS — K219 Gastro-esophageal reflux disease without esophagitis: Secondary | ICD-10-CM | POA: Insufficient documentation

## 2012-02-05 DIAGNOSIS — Z880 Allergy status to penicillin: Secondary | ICD-10-CM | POA: Insufficient documentation

## 2012-02-05 DIAGNOSIS — Z8249 Family history of ischemic heart disease and other diseases of the circulatory system: Secondary | ICD-10-CM | POA: Insufficient documentation

## 2012-02-05 DIAGNOSIS — Z881 Allergy status to other antibiotic agents status: Secondary | ICD-10-CM | POA: Insufficient documentation

## 2012-02-05 DIAGNOSIS — K029 Dental caries, unspecified: Secondary | ICD-10-CM | POA: Insufficient documentation

## 2012-02-05 DIAGNOSIS — Z833 Family history of diabetes mellitus: Secondary | ICD-10-CM | POA: Insufficient documentation

## 2012-02-05 DIAGNOSIS — F17200 Nicotine dependence, unspecified, uncomplicated: Secondary | ICD-10-CM | POA: Insufficient documentation

## 2012-02-05 MED ORDER — OXYCODONE-ACETAMINOPHEN 5-325 MG PO TABS
2.0000 | ORAL_TABLET | Freq: Once | ORAL | Status: AC
  Administered 2012-02-05: 2 via ORAL
  Filled 2012-02-05: qty 2

## 2012-02-05 MED ORDER — OXYCODONE-ACETAMINOPHEN 7.5-325 MG PO TABS: 1.0000 | ORAL_TABLET | ORAL | Status: DC | PRN

## 2012-02-05 NOTE — ED Provider Notes (Signed)
Medical screening examination/treatment/procedure(s) were performed by non-physician practitioner and as supervising physician I was immediately available for consultation/collaboration.   Irineo Gaulin L Eliyah Mcshea, MD 02/05/12 1500 

## 2012-02-05 NOTE — ED Notes (Signed)
C/o left upper toothache x 3 months.

## 2012-02-05 NOTE — ED Provider Notes (Signed)
History     CSN: [id number]  Arrival date & time 02/05/12  0848   First MD Initiated Contact with Patient 02/05/12 0900      Chief Complaint  Patient presents with  . Dental Pain    (Consider location/radiation/quality/duration/timing/severity/associated sxs/prior treatment) HPI Comments: Ryan Valentine  presents with a 3 month history of dental pain stemming from a filling that fell out which he has not been able to get repaired.  He is unable to sleep at night due to pain.  He has just finished a course of clindamycin prescribed at a previous visit here.  He will have dental insurance 03/15/12 but does not believe he can stand the pain that long.   There has been no fevers,  Chills, nausea or vomiting, also no complaint of difficulty swallowing,  Although chewing makes pain worse.  The patient has tried advil and naprosen without relief of symptoms.      The history is provided by the patient.    Past Medical History  Diagnosis Date  . Back pain   . Headache     occ headaches  . GERD (gastroesophageal reflux disease)     Past Surgical History  Procedure Date  . Knee arthroscopy   . Lumbar laminectomy/decompression microdiscectomy 06/05/2011    Procedure: LUMBAR LAMINECTOMY/DECOMPRESSION MICRODISCECTOMY;  Surgeon: Temple Pacini, MD;  Location: [facility];  Service: Neurosurgery;  Laterality: Right;  Right Lumbar five-Sacral one laminectomy/microdiscectomy  . Back surgery     Family History  Problem Relation Age of Onset  . Diabetes Mother   . Hypertension Mother   . Diabetes Father     History  Substance Use Topics  . Smoking status: Current Every Day Smoker -- 1.0 packs/day  . Smokeless tobacco: Not on file  . Alcohol Use: No      Review of Systems  Constitutional: Negative for fever.  HENT: Positive for dental problem. Negative for sore throat, facial swelling, neck pain and neck stiffness.   Respiratory: Negative for shortness of breath.     Allergies   Ms contin; Penicillins; Tramadol; Erythrocin; and Ketorolac tromethamine  Home Medications   Current Outpatient Rx  Name Route Sig Dispense Refill  . ASPIRIN-ACETAMINOPHEN-CAFFEINE 250-250-65 MG PO TABS Oral Take 2 tablets by mouth as needed. For migraine/headache pain    . IBUPROFEN 200 MG PO TABS Oral Take 800 mg by mouth every 8 (eight) hours as needed. For pain    . NAPROXEN 250 MG PO TABS Oral Take 1 tablet (250 mg total) by mouth 2 (two) times daily with a meal. 14 tablet 0    BP 144/89  Pulse 90  Temp 98.2 F (36.8 C)  Resp 16  Ht 6\' 5"  (1.956 m)  Wt 205 lb (92.987 kg)  BMI 24.31 kg/m2  SpO2 98%  Physical Exam  Constitutional: He is oriented to person, place, and time. He appears well-developed and well-nourished. No distress.  HENT:  Head: Normocephalic and atraumatic.  Right Ear: Tympanic membrane and external ear normal.  Left Ear: Tympanic membrane and external ear normal.  Mouth/Throat: Oropharynx is clear and moist and mucous membranes are normal. No oral lesions. Dental caries present. No dental abscesses.    Eyes: Conjunctivae normal are normal.  Neck: Normal range of motion. Neck supple.  Cardiovascular: Normal rate and normal heart sounds.   Pulmonary/Chest: Effort normal.  Abdominal: He exhibits no distension.  Musculoskeletal: Normal range of motion.  Lymphadenopathy:    He has no cervical  adenopathy.  Neurological: He is alert and oriented to person, place, and time.  Skin: Skin is warm and dry. No erythema.  Psychiatric: He has a normal mood and affect.    ED Course  Procedures (including critical care time)  Labs Reviewed - No data to display No results found.   1. Dental cavity       MDM  Prescribed oxycodone.  No sign of infection in this tooth today.  Referral to free dental clinic in [place] to take place in 4 days.        Burgess Amor, PA 02/05/12 1003

## 2012-02-10 ENCOUNTER — Emergency Department (HOSPITAL_COMMUNITY)
Admission: EM | Admit: 2012-02-10 | Discharge: 2012-02-11 | Disposition: A | Discharge status: Home / Self Care | Payer: Self-pay | Attending: Emergency Medicine | Admitting: Emergency Medicine

## 2012-02-10 ENCOUNTER — Encounter (HOSPITAL_COMMUNITY): Payer: Self-pay | Admitting: *Deleted

## 2012-02-10 ENCOUNTER — Emergency Department (HOSPITAL_COMMUNITY)
Admission: EM | Admit: 2012-02-10 | Discharge: 2012-02-10 | Disposition: A | Discharge status: Home / Self Care | Payer: Self-pay | Attending: Emergency Medicine | Admitting: Emergency Medicine

## 2012-02-10 DIAGNOSIS — Z888 Allergy status to other drugs, medicaments and biological substances status: Secondary | ICD-10-CM | POA: Insufficient documentation

## 2012-02-10 DIAGNOSIS — L03211 Cellulitis of face: Secondary | ICD-10-CM | POA: Insufficient documentation

## 2012-02-10 DIAGNOSIS — K219 Gastro-esophageal reflux disease without esophagitis: Secondary | ICD-10-CM | POA: Insufficient documentation

## 2012-02-10 DIAGNOSIS — L0291 Cutaneous abscess, unspecified: Secondary | ICD-10-CM | POA: Insufficient documentation

## 2012-02-10 DIAGNOSIS — F17200 Nicotine dependence, unspecified, uncomplicated: Secondary | ICD-10-CM | POA: Insufficient documentation

## 2012-02-10 DIAGNOSIS — Z833 Family history of diabetes mellitus: Secondary | ICD-10-CM | POA: Insufficient documentation

## 2012-02-10 DIAGNOSIS — Z8249 Family history of ischemic heart disease and other diseases of the circulatory system: Secondary | ICD-10-CM | POA: Insufficient documentation

## 2012-02-10 DIAGNOSIS — Z880 Allergy status to penicillin: Secondary | ICD-10-CM | POA: Insufficient documentation

## 2012-02-10 DIAGNOSIS — L0201 Cutaneous abscess of face: Secondary | ICD-10-CM | POA: Insufficient documentation

## 2012-02-10 MED ORDER — OXYCODONE-ACETAMINOPHEN 5-325 MG PO TABS: 1.0000 | ORAL_TABLET | ORAL | Status: DC | PRN

## 2012-02-10 MED ORDER — SULFAMETHOXAZOLE-TRIMETHOPRIM 800-160 MG PO TABS: 1.0000 | ORAL_TABLET | Freq: Two times a day (BID) | ORAL | Status: DC

## 2012-02-10 MED ORDER — LIDOCAINE HCL (PF) 2 % IJ SOLN
10.0000 mL | Freq: Once | INTRAMUSCULAR | Status: AC
  Administered 2012-02-10: 10 mL
  Filled 2012-02-10 (×2): qty 10

## 2012-02-10 MED ORDER — HYDROMORPHONE HCL PF 1 MG/ML IJ SOLN
1.0000 mg | Freq: Once | INTRAMUSCULAR | Status: AC
  Administered 2012-02-10: 1 mg via INTRAMUSCULAR
  Filled 2012-02-10: qty 1

## 2012-02-10 NOTE — ED Provider Notes (Signed)
History     CSN: [id number]  Arrival date & time 02/10/12  9562   First MD Initiated Contact with Patient 02/10/12 1004      Chief Complaint  Patient presents with  . Dental Pain    (Consider location/radiation/quality/duration/timing/severity/associated sxs/prior treatment) HPI Comments: Patient c/o pain to the right side of his mouth that began yesterday.  Unsure of which tooth is tender.  He was seen here 5 day ago for left sided toothache and states he has ran out of his pain medication.  Also noticed a small red "bump" to his right cheek yesterday and states that his children have been treated for MRSA in the past.  He denies fever, neck pain, swelling of the gums, or difficulty swallowing.    Patient is a 31 y.o. male presenting with tooth pain. The history is provided by the patient.  Dental PainThe primary symptoms include mouth pain. Primary symptoms do not include headaches, fever, shortness of breath, sore throat, angioedema or cough. The symptoms began yesterday. The symptoms are worsening. The symptoms are new. The symptoms occur constantly.  Additional symptoms include: facial swelling. Additional symptoms do not include: dental sensitivity to temperature, gum swelling, gum tenderness, trismus, jaw pain, trouble swallowing, pain with swallowing, ear pain and swollen glands. Medical issues include: smoking and periodontal disease.    Past Medical History  Diagnosis Date  . Back pain   . Headache     occ headaches  . GERD (gastroesophageal reflux disease)     Past Surgical History  Procedure Date  . Knee arthroscopy   . Lumbar laminectomy/decompression microdiscectomy 06/05/2011    Procedure: LUMBAR LAMINECTOMY/DECOMPRESSION MICRODISCECTOMY;  Surgeon: Temple Pacini, MD;  Location: [facility];  Service: Neurosurgery;  Laterality: Right;  Right Lumbar five-Sacral one laminectomy/microdiscectomy  . Back surgery     Family History  Problem Relation Age of Onset  .  Diabetes Mother   . Hypertension Mother   . Diabetes Father     History  Substance Use Topics  . Smoking status: Current Every Day Smoker -- 1.0 packs/day  . Smokeless tobacco: Not on file  . Alcohol Use: No      Review of Systems  Constitutional: Negative for fever, activity change and appetite change.  HENT: Positive for facial swelling and dental problem. Negative for ear pain, sore throat, trouble swallowing, neck pain and neck stiffness.   Eyes: Negative for visual disturbance.  Respiratory: Negative for cough and shortness of breath.   Skin:       Papule right cheek  Neurological: Negative for dizziness and headaches.  Hematological: Negative for adenopathy.  All other systems reviewed and are negative.    Allergies  Ms contin; Penicillins; Tramadol; Erythrocin; and Ketorolac tromethamine  Home Medications   Current Outpatient Rx  Name Route Sig Dispense Refill  . ASPIRIN-ACETAMINOPHEN-CAFFEINE 250-250-65 MG PO TABS Oral Take 2 tablets by mouth as needed. For migraine/headache pain    . IBUPROFEN 200 MG PO TABS Oral Take 800 mg by mouth every 8 (eight) hours as needed. For pain    . NAPROXEN 250 MG PO TABS Oral Take 1 tablet (250 mg total) by mouth 2 (two) times daily with a meal. 14 tablet 0  . OXYCODONE-ACETAMINOPHEN 7.5-325 MG PO TABS Oral Take 1 tablet by mouth every 4 (four) hours as needed for pain. 20 tablet 0    BP 123/95  Pulse 94  Temp 98.7 F (37.1 C) (Oral)  Resp 16  SpO2 100%  Physical Exam  Nursing note and vitals reviewed. Constitutional: He is oriented to person, place, and time. He appears well-developed and well-nourished. No distress.  HENT:  Head: Normocephalic and atraumatic.    Right Ear: Tympanic membrane and ear canal normal.  Left Ear: Tympanic membrane and ear canal normal.  Mouth/Throat: Uvula is midline, oropharynx is clear and moist and mucous membranes are normal.       No obvious edema, erythema or abscess of the gums.   Several dental caries.  1 cm erythematous papule to right cheek.  Mild induration present, no drainage or red streaks  Neck: Normal range of motion. Neck supple.  Cardiovascular: Normal rate, regular rhythm and normal heart sounds.   Pulmonary/Chest: Effort normal and breath sounds normal.  Musculoskeletal: Normal range of motion.  Lymphadenopathy:    He has no cervical adenopathy.  Neurological: He is alert and oriented to person, place, and time. He exhibits normal muscle tone. Coordination normal.  Skin: Skin is warm. There is erythema.       See HENT exam    ED Course  Procedures (including critical care time)  Labs Reviewed - No data to display No results found.      MDM     Previous ED chart reviewed.  Seen here 5 days ago for left dental pain   Pea sized , erythematous papule to the right cheek.  Mild STS and induration.  No fluctuance.  Pt c/o dental pain, but no obvious dental abscess seen.  Likely early abscess of the skin.  He agrees to warm wet compresses.  Advised to return here if the sx's worsen.    The patient appears reasonably screened and/or stabilized for discharge and I doubt any other medical condition or other [facility] requiring further screening, evaluation, or treatment in the ED at this time prior to discharge.   Prescribed:  Bactrim DS Percocet #20        Rashunda Passon L. Tieshia Rettinger, PA 02/10/12 1028  Sheddrick Lattanzio L. Dontrae Morini, [place] 02/10/12 1029

## 2012-02-10 NOTE — ED Notes (Signed)
Dental pain. Swelling right lower jaw.

## 2012-02-10 NOTE — ED Notes (Signed)
Pt reports being seen previously for dental abscess, states swelling and pain have increased since being seen.

## 2012-02-10 NOTE — ED Notes (Signed)
States that he was seen here today for an area on his face, states the area has gotten more painful since he was seen here and states that his pain medication is not helping at all.

## 2012-02-11 ENCOUNTER — Encounter (HOSPITAL_COMMUNITY): Payer: Self-pay | Admitting: *Deleted

## 2012-02-11 ENCOUNTER — Emergency Department (HOSPITAL_COMMUNITY)
Admission: EM | Admit: 2012-02-11 | Discharge: 2012-02-11 | Disposition: A | Discharge status: Home / Self Care | Payer: Self-pay | Attending: Emergency Medicine | Admitting: Emergency Medicine

## 2012-02-11 DIAGNOSIS — Z880 Allergy status to penicillin: Secondary | ICD-10-CM | POA: Insufficient documentation

## 2012-02-11 DIAGNOSIS — L03211 Cellulitis of face: Secondary | ICD-10-CM | POA: Insufficient documentation

## 2012-02-11 DIAGNOSIS — F17200 Nicotine dependence, unspecified, uncomplicated: Secondary | ICD-10-CM | POA: Insufficient documentation

## 2012-02-11 DIAGNOSIS — K219 Gastro-esophageal reflux disease without esophagitis: Secondary | ICD-10-CM | POA: Insufficient documentation

## 2012-02-11 DIAGNOSIS — L0201 Cutaneous abscess of face: Secondary | ICD-10-CM | POA: Insufficient documentation

## 2012-02-11 MED ORDER — OXYCODONE-ACETAMINOPHEN 10-325 MG PO TABS: 1.0000 | ORAL_TABLET | ORAL | Status: DC | PRN

## 2012-02-11 MED ORDER — HYDROMORPHONE HCL PF 1 MG/ML IJ SOLN
1.0000 mg | Freq: Once | INTRAMUSCULAR | Status: AC
  Administered 2012-02-11: 1 mg via INTRAMUSCULAR
  Filled 2012-02-11: qty 1

## 2012-02-11 MED ORDER — CLINDAMYCIN HCL 300 MG PO CAPS: 300.0000 mg | ORAL_CAPSULE | Freq: Four times a day (QID) | ORAL | Status: DC

## 2012-02-11 MED ORDER — IBUPROFEN 800 MG PO TABS: 800.0000 mg | ORAL_TABLET | Freq: Three times a day (TID) | ORAL | Status: DC

## 2012-02-11 NOTE — ED Notes (Signed)
Pt states that he started having a bump on his right side of face and then over night a ouple of nights ago it started swelling pt went to [facility] was given antibiotics and they opened the area and then tonight the area started swelling more. Pt states very painful. Pt does have swelling to right side of face.

## 2012-02-11 NOTE — ED Provider Notes (Signed)
Medical screening examination/treatment/procedure(s) were performed by non-physician practitioner and as supervising physician I was immediately available for consultation/collaboration.   [name], MD 02/11/12 [phone]

## 2012-02-11 NOTE — ED Provider Notes (Signed)
History     CSN: [id number]  Arrival date & time 02/10/12  2200   First MD Initiated Contact with Patient 02/10/12 2237      Chief Complaint  Patient presents with  . Abscess    (Consider location/radiation/quality/duration/timing/severity/associated sxs/prior treatment) HPI Comments: Ryan Valentine presents with pain and swelling of his right cheek which has become increasingly painful and more swollen since he was seen here earlier today and placed on bactrim,  Of which he has had 2 doses so far.  He does have a history of a left dental abscess which is currently ok,  And denies any right sided dental pain or problems that may be contributing to his presentation today.  He does have a papule on his right cheek which has been since yesterday, which he states he has squeezed several times and obtained a small amount of yellow drainage.  He denies any current gum swelling or tenderness, pharyngitis,  Dental sensitivity to temperature , jaw pain and trismus.  He denies fevers.  His children have been treated for MRSA in the past. He has taken oxycodone without relief of pain.  The history is provided by the patient.    Past Medical History  Diagnosis Date  . Back pain   . Headache     occ headaches  . GERD (gastroesophageal reflux disease)     Past Surgical History  Procedure Date  . Knee arthroscopy   . Lumbar laminectomy/decompression microdiscectomy 06/05/2011    Procedure: LUMBAR LAMINECTOMY/DECOMPRESSION MICRODISCECTOMY;  Surgeon: Temple Pacini, MD;  Location: [facility];  Service: Neurosurgery;  Laterality: Right;  Right Lumbar five-Sacral one laminectomy/microdiscectomy  . Back surgery     Family History  Problem Relation Age of Onset  . Diabetes Mother   . Hypertension Mother   . Diabetes Father     History  Substance Use Topics  . Smoking status: Current Every Day Smoker -- 1.0 packs/day  . Smokeless tobacco: Not on file  . Alcohol Use: No      Review of  Systems  Constitutional: Negative for fever and chills.  HENT: Positive for facial swelling. Negative for sore throat and trouble swallowing.   Respiratory: Negative for shortness of breath and wheezing.   Skin: Positive for wound.  Neurological: Negative for numbness.    Allergies  Ms contin; Penicillins; Tramadol; Erythrocin; and Ketorolac tromethamine  Home Medications   Current Outpatient Rx  Name Route Sig Dispense Refill  . ASPIRIN-ACETAMINOPHEN-CAFFEINE 250-250-65 MG PO TABS Oral Take 2 tablets by mouth as needed. For migraine/headache pain    . IBUPROFEN 200 MG PO TABS Oral Take 800 mg by mouth every 8 (eight) hours as needed. For pain    . OXYCODONE-ACETAMINOPHEN 7.5-325 MG PO TABS Oral Take 1 tablet by mouth every 4 (four) hours as needed for pain. 20 tablet 0  . SULFAMETHOXAZOLE-TRIMETHOPRIM 800-160 MG PO TABS Oral Take 1 tablet by mouth 2 (two) times daily. 20 tablet 0  . IBUPROFEN 800 MG PO TABS Oral Take 1 tablet (800 mg total) by mouth 3 (three) times daily. 21 tablet 0  . OXYCODONE-ACETAMINOPHEN 10-325 MG PO TABS Oral Take 1 tablet by mouth every 4 (four) hours as needed for pain. 30 tablet 0    BP 136/81  Pulse 88  Temp 98.4 F (36.9 C) (Oral)  Resp 16  SpO2 96%  Physical Exam  Constitutional: He is oriented to person, place, and time. He appears well-developed and well-nourished.  HENT:  Head:  Normocephalic.  Right Ear: External ear normal.  Left Ear: External ear normal.       Moderate tender 2 cm area of edema to right mid cheek. Non drainage central papule.  No fluctuance, mild erythema,  Skin appears tense, no spontaneous drainage with gentle pressure.  Gingiva of upper and lower right dentition is nontender,  No edema,  No obvious dental source for abscess.  No purulent drainage from parotid duct.  No subungual pain or induration  Cardiovascular: Normal rate.   Pulmonary/Chest: Effort normal.  Neurological: He is alert and oriented to person, place, and  time. No sensory deficit.  Skin: Laceration noted.    ED Course  Procedures (including critical care time)   INCISION AND DRAINAGE Performed by: Burgess Amor Consent: Verbal consent obtained. Risks and benefits: risks, benefits and alternatives were discussed Type: abscess  Body area: right cheek - very small incision to right cheek - width of #11 blade only,  Incision made below level of parotid duct.  Anesthesia: local infiltration  Local anesthetic: lidocaine 2% without epinephrine  Anesthetic total: 1.5 ml  Complexity: complex Blunt dissection to break up loculations  Drainage: purulent    Drainage amount: minimal  Packing material: not amenable to packing  Patient tolerance: Patient tolerated the procedure well with no immediate complications.     Labs Reviewed - No data to display No results found.   1. Abscess of face       MDM  Pt encouraged to start warm compresses immediately once home,  Continue bactrim,  Add ibuprofen,  Prescribed higher dose of oxycodone.  Recheck here if not starting to resolve over the next 24 hours.  Also referral to Dr.  Janna Arch for pcp.        Burgess Amor, [place] 02/11/12 1358

## 2012-02-11 NOTE — ED Provider Notes (Signed)
Medical screening examination/treatment/procedure(s) were performed by non-physician practitioner and as supervising physician I was immediately available for consultation/collaboration.  Tobin Chad, MD 02/11/12 [phone]

## 2012-02-11 NOTE — ED Provider Notes (Signed)
History     CSN: [id number]  Arrival date & time 02/11/12  2014   First MD Initiated Contact with Patient 02/11/12 2055      Chief Complaint  Patient presents with  . Abscess   HPI  History provided by patient and recent medical chart. Patient is a 31 year old male who returns with complaints of increasing swelling and pain to right cheek from infection. Patient was seen yesterday at [facility] for similar complaints. he was treated with I&D for an abscess to his right cheek. he was placed on Bactrim and has taken a few doses but reports increased swelling after sleeping last night. Patient is taking Percocet 10 mg to help with symptoms but states he still having significant pain. He denies having any fever, chills or sweats. He has been using occasional warm compresses over the area. He has not done any other treatments for symptoms. He denies any dental pain. No difficulty swallowing or breathing.   Past Medical History  Diagnosis Date  . Back pain   . Headache     occ headaches  . GERD (gastroesophageal reflux disease)     Past Surgical History  Procedure Date  . Knee arthroscopy   . Lumbar laminectomy/decompression microdiscectomy 06/05/2011    Procedure: LUMBAR LAMINECTOMY/DECOMPRESSION MICRODISCECTOMY;  Surgeon: Temple Pacini, MD;  Location: [facility];  Service: Neurosurgery;  Laterality: Right;  Right Lumbar five-Sacral one laminectomy/microdiscectomy  . Back surgery     Family History  Problem Relation Age of Onset  . Diabetes Mother   . Hypertension Mother   . Diabetes Father     History  Substance Use Topics  . Smoking status: Current Every Day Smoker -- 1.0 packs/day  . Smokeless tobacco: Not on file  . Alcohol Use: No      Review of Systems  Constitutional: Negative for fever, chills and appetite change.  HENT: Negative for dental problem.   Gastrointestinal: Negative for nausea and vomiting.    Allergies  Ms contin; Penicillins; Tramadol;  Erythrocin; and Ketorolac tromethamine  Home Medications   Current Outpatient Rx  Name Route Sig Dispense Refill  . ASPIRIN-ACETAMINOPHEN-CAFFEINE 250-250-65 MG PO TABS Oral Take 2 tablets by mouth as needed. For migraine/headache pain    . IBUPROFEN 200 MG PO TABS Oral Take 800 mg by mouth every 8 (eight) hours as needed. For pain    . IBUPROFEN 800 MG PO TABS Oral Take 1 tablet (800 mg total) by mouth 3 (three) times daily. 21 tablet 0  . OXYCODONE-ACETAMINOPHEN 10-325 MG PO TABS Oral Take 1 tablet by mouth every 4 (four) hours as needed for pain. 30 tablet 0  . SULFAMETHOXAZOLE-TRIMETHOPRIM 800-160 MG PO TABS Oral Take 1 tablet by mouth 2 (two) times daily. 20 tablet 0    BP 122/73  Pulse 79  Temp 98.1 F (36.7 C) (Oral)  Resp 16  SpO2 97%  Physical Exam  Nursing note and vitals reviewed. Constitutional: He appears well-developed and well-nourished.  HENT:  Head: Normocephalic.       Significant swelling of right cheek with erythema and induration. Area is tender to palpation. There is a small healing incision without bleeding or drainage.  No pain over teeth or swelling of the gums.  Eyes: Pupils are equal, round, and reactive to light.  Neck: Normal range of motion. Neck supple.  Cardiovascular: Normal rate and regular rhythm.   Pulmonary/Chest: Effort normal and breath sounds normal. No respiratory distress. He has no wheezes. He has  no rales.  Lymphadenopathy:    He has no cervical adenopathy.  Neurological: He is alert.  Skin: Skin is warm.  Psychiatric: He has a normal mood and affect. His behavior is normal.    ED Course  Procedures   INCISION AND DRAINAGE Performed by: Angus Seller Consent: Verbal consent obtained. Risks and benefits: risks, benefits and alternatives were discussed Type: abscess  Body area: right cheek  Anesthesia: local infiltration  Local anesthetic: lidocaine 2% with epinephrine  Anesthetic total: 4 ml  Complexity:  complex Blunt dissection to break up loculations  Drainage: purulent  Drainage amount: Moderate to large   Packing material: None   Patient tolerance: Patient tolerated the procedure well with no immediate complications.     1. Abscess of face       MDM  Patient seen and evaluated. Patient no acute distress. Patient does not appear toxic. I&D performed over the same area with good amount of drainage. At this time we'll continue outpatient treatment of symptoms. Will add prescription for clindamycin. Patient instructed to continue Bactrim.       Angus Seller, [place] 02/11/12 2322

## 2012-02-12 ENCOUNTER — Encounter (HOSPITAL_COMMUNITY): Payer: Self-pay | Admitting: *Deleted

## 2012-02-12 ENCOUNTER — Inpatient Hospital Stay (HOSPITAL_COMMUNITY)
Admission: EM | Admit: 2012-02-12 | Discharge: 2012-02-14 | DRG: 603 | Disposition: A | Discharge status: Home / Self Care | Payer: Self-pay | Attending: Internal Medicine | Admitting: Internal Medicine

## 2012-02-12 ENCOUNTER — Emergency Department (HOSPITAL_COMMUNITY): Payer: Self-pay

## 2012-02-12 DIAGNOSIS — K219 Gastro-esophageal reflux disease without esophagitis: Secondary | ICD-10-CM | POA: Diagnosis present

## 2012-02-12 DIAGNOSIS — L0201 Cutaneous abscess of face: Principal | ICD-10-CM | POA: Diagnosis present

## 2012-02-12 DIAGNOSIS — Z720 Tobacco use: Secondary | ICD-10-CM | POA: Diagnosis present

## 2012-02-12 DIAGNOSIS — Z881 Allergy status to other antibiotic agents status: Secondary | ICD-10-CM

## 2012-02-12 DIAGNOSIS — Z885 Allergy status to narcotic agent status: Secondary | ICD-10-CM

## 2012-02-12 DIAGNOSIS — Z886 Allergy status to analgesic agent status: Secondary | ICD-10-CM

## 2012-02-12 DIAGNOSIS — Z880 Allergy status to penicillin: Secondary | ICD-10-CM

## 2012-02-12 DIAGNOSIS — L03211 Cellulitis of face: Principal | ICD-10-CM | POA: Diagnosis present

## 2012-02-12 DIAGNOSIS — F17200 Nicotine dependence, unspecified, uncomplicated: Secondary | ICD-10-CM | POA: Diagnosis present

## 2012-02-12 DIAGNOSIS — M5126 Other intervertebral disc displacement, lumbar region: Secondary | ICD-10-CM

## 2012-02-12 LAB — BASIC METABOLIC PANEL
BUN: 9 mg/dL (ref 6–23)
Calcium: 9 mg/dL (ref 8.4–10.5)
GFR calc Af Amer: >90 mL/min (ref >90)
GFR calc non Af Amer: >90 mL/min (ref >90)
Glucose, Bld: 82 mg/dL (ref 70–99)
Potassium: 3.7 mEq/L (ref 3.5–5.1)
Sodium: 136 mEq/L (ref 135–145)

## 2012-02-12 LAB — CBC WITH DIFFERENTIAL/PLATELET
Basophils Relative: 0 % (ref 0–1)
Eosinophils Absolute: 0.1 10*3/uL (ref 0.0–0.7)
Eosinophils Relative: 1 % (ref 0–5)
MCH: 31.3 pg (ref 26.0–34.0)
MCHC: 34.8 g/dL (ref 30.0–36.0)
MCV: 90 fL (ref 78.0–100.0)
Neutrophils Relative %: 83 % — ABNORMAL HIGH (ref 43–77)
Platelets: 221 10*3/uL (ref 150–400)

## 2012-02-12 MED ORDER — OXYCODONE-ACETAMINOPHEN 10-325 MG PO TABS: 1.0000 | ORAL_TABLET | ORAL | Status: DC | PRN

## 2012-02-12 MED ORDER — MAGNESIUM HYDROXIDE 400 MG/5ML PO SUSP: 30.0000 mL | Freq: Every day | ORAL | Status: DC | PRN

## 2012-02-12 MED ORDER — OXYCODONE HCL 5 MG PO TABS
5.0000 mg | ORAL_TABLET | ORAL | Status: DC | PRN
  Administered 2012-02-12 – 2012-02-14 (×7): 5 mg via ORAL
  Filled 2012-02-12 (×7): qty 1

## 2012-02-12 MED ORDER — IBUPROFEN 600 MG PO TABS
600.0000 mg | ORAL_TABLET | Freq: Three times a day (TID) | ORAL | Status: DC
  Administered 2012-02-12 – 2012-02-13 (×4): 600 mg via ORAL
  Filled 2012-02-12 (×5): qty 1

## 2012-02-12 MED ORDER — ONDANSETRON HCL 4 MG/2ML IJ SOLN
4.0000 mg | Freq: Once | INTRAMUSCULAR | Status: AC
  Administered 2012-02-12: 4 mg via INTRAVENOUS
  Filled 2012-02-12: qty 2

## 2012-02-12 MED ORDER — SODIUM CHLORIDE 0.9 % IJ SOLN
3.0000 mL | Freq: Two times a day (BID) | INTRAMUSCULAR | Status: DC
  Administered 2012-02-13 (×2): 3 mL via INTRAVENOUS
  Filled 2012-02-12 (×2): qty 3

## 2012-02-12 MED ORDER — NICOTINE 21 MG/24HR TD PT24
21.0000 mg | MEDICATED_PATCH | Freq: Every day | TRANSDERMAL | Status: DC
  Administered 2012-02-12 – 2012-02-13 (×3): 21 mg via TRANSDERMAL
  Filled 2012-02-12 (×3): qty 1

## 2012-02-12 MED ORDER — SODIUM CHLORIDE 0.9 % IJ SOLN: 3.0000 mL | INTRAMUSCULAR | Status: DC | PRN

## 2012-02-12 MED ORDER — HYDROMORPHONE HCL PF 1 MG/ML IJ SOLN
1.0000 mg | Freq: Once | INTRAMUSCULAR | Status: AC
  Administered 2012-02-12: 1 mg via INTRAVENOUS
  Filled 2012-02-12: qty 1

## 2012-02-12 MED ORDER — HYDROMORPHONE HCL PF 1 MG/ML IJ SOLN
1.0000 mg | INTRAMUSCULAR | Status: DC | PRN
  Administered 2012-02-12: 2 mg via INTRAVENOUS
  Administered 2012-02-12: 1 mg via INTRAVENOUS
  Administered 2012-02-13 (×3): 2 mg via INTRAVENOUS
  Administered 2012-02-13: 1 mg via INTRAVENOUS
  Administered 2012-02-13 – 2012-02-14 (×4): 2 mg via INTRAVENOUS
  Filled 2012-02-12 (×3): qty 2
  Filled 2012-02-12 (×2): qty 1
  Filled 2012-02-12 (×5): qty 2

## 2012-02-12 MED ORDER — DOCUSATE SODIUM 100 MG PO CAPS
100.0000 mg | ORAL_CAPSULE | Freq: Two times a day (BID) | ORAL | Status: DC
  Administered 2012-02-12 – 2012-02-13 (×3): 100 mg via ORAL
  Filled 2012-02-12 (×3): qty 1

## 2012-02-12 MED ORDER — IOHEXOL 300 MG/ML  SOLN
75.0000 mL | Freq: Once | INTRAMUSCULAR | Status: AC | PRN
  Administered 2012-02-12: 75 mL via INTRAVENOUS

## 2012-02-12 MED ORDER — VANCOMYCIN HCL IN DEXTROSE 1-5 GM/200ML-% IV SOLN
1000.0000 mg | Freq: Once | INTRAVENOUS | Status: AC
  Administered 2012-02-12: 1000 mg via INTRAVENOUS
  Filled 2012-02-12: qty 200

## 2012-02-12 MED ORDER — VANCOMYCIN HCL IN DEXTROSE 1-5 GM/200ML-% IV SOLN
1000.0000 mg | Freq: Two times a day (BID) | INTRAVENOUS | Status: DC
  Administered 2012-02-12 – 2012-02-13 (×3): 1000 mg via INTRAVENOUS
  Filled 2012-02-12 (×4): qty 200

## 2012-02-12 MED ORDER — OXYCODONE-ACETAMINOPHEN 5-325 MG PO TABS
1.0000 | ORAL_TABLET | ORAL | Status: DC | PRN
  Administered 2012-02-12 – 2012-02-14 (×4): 1 via ORAL
  Filled 2012-02-12 (×4): qty 1

## 2012-02-12 MED ORDER — VANCOMYCIN HCL IN DEXTROSE 1-5 GM/200ML-% IV SOLN
INTRAVENOUS | Status: AC
  Filled 2012-02-12: qty 200

## 2012-02-12 MED ORDER — ENOXAPARIN SODIUM 40 MG/0.4ML ~~LOC~~ SOLN
40.0000 mg | SUBCUTANEOUS | Status: DC
  Administered 2012-02-12 – 2012-02-13 (×2): 40 mg via SUBCUTANEOUS
  Filled 2012-02-12 (×2): qty 0.4

## 2012-02-12 MED ORDER — SODIUM CHLORIDE 0.9 % IV SOLN: 250.0000 mL | INTRAVENOUS | Status: DC | PRN

## 2012-02-12 NOTE — H&P (Signed)
Hospital Admission Note Date: 02/12/2012  Patient name: Ryan Valentine Medical record number: [id number] Date of birth: October 23, 1980 Age: 31 y.o. Gender: male PCP: Selinda Flavin, MD  Attending physician: Christiane Ha, MD  Chief Complaint: facial pain and infection  History of Present Illness:  Ryan Valentine is an 31 y.o. male who presents with infection involving the right cheek for several days. He has been seen in the emergency room several times. He has had 2 incisions and drainage. He has been on doxycycline and Bactrim without improvement so he returned today. He's had no fevers or chills. His kids have had MRSA infections. He's never had a similar infection. He has a bad tooth on the opposite side but nothing on the right side. Last incision and drainage described yesterday yielded moderate pus. It does not appear that it was sent for culture. He's had no continuing drainage. After I evaluated the patient initially, I ordered the maxillofacial CAT scan which showed no abscess.  Past Medical History  Diagnosis Date  . Back pain   . Headache     occ headaches  . GERD (gastroesophageal reflux disease)     Meds: Prescriptions prior to admission  Medication Sig Dispense Refill  . doxycycline (VIBRA-TABS) 100 MG tablet Take 100 mg by mouth 2 (two) times daily.      Marland Kitchen ibuprofen (ADVIL,MOTRIN) 800 MG tablet Take 1 tablet (800 mg total) by mouth 3 (three) times daily.  21 tablet  0  . oxyCODONE-acetaminophen (PERCOCET) 10-325 MG per tablet Take 1 tablet by mouth every 4 (four) hours as needed for pain.  30 tablet  0  . sulfamethoxazole-trimethoprim (SEPTRA DS) 800-160 MG per tablet Take 1 tablet by mouth 2 (two) times daily.  20 tablet  0    Allergies: Ms contin; Penicillins; Tramadol; Erythrocin; and Ketorolac tromethamine History   Social History  . Marital Status: Married    Spouse Name: N/A    Number of Children: N/A  . Years of Education: N/A   Occupational History   . Not on file.   Social History Main Topics  . Smoking status: Current Every Day Smoker -- 1.0 packs/day  . Smokeless tobacco: Not on file  . Alcohol Use: 0.0 oz/week    0-1 Glasses of wine per week  . Drug Use: No  . Sexually Active: Yes   Other Topics Concern  . Not on file   Social History Narrative  . No narrative on file   Family History  Problem Relation Age of Onset  . Diabetes Mother   . Hypertension Mother   . Diabetes Father    Past Surgical History  Procedure Date  . Knee arthroscopy   . Lumbar laminectomy/decompression microdiscectomy 06/05/2011    Procedure: LUMBAR LAMINECTOMY/DECOMPRESSION MICRODISCECTOMY;  Surgeon: Temple Pacini, MD;  Location: [facility];  Service: Neurosurgery;  Laterality: Right;  Right Lumbar five-Sacral one laminectomy/microdiscectomy  . Back surgery     Review of Systems: Systems reviewed and as per HPI, otherwise negative.  Physical Exam: Blood pressure 120/80, pulse 59, temperature 98.1 F (36.7 C), temperature source Oral, resp. rate 20, height 6\' 5"  (1.956 m), weight 84.9 kg (187 lb 2.7 oz), SpO2 100.00%. BP 120/80  Pulse 59  Temp 98.1 F (36.7 C) (Oral)  Resp 20  Ht 6\' 5"  (1.956 m)  Wt 84.9 kg (187 lb 2.7 oz)  BMI 22.20 kg/m2  SpO2 100%  General Appearance:    Alert, cooperative, uncomfortable   Head:  Normocephalic, without obvious abnormality, atraumatic right cheek and face massively edematous, erythematous, tender and warm. No definite fluctuance. 2 small incisions without drainage.   Eyes:    PERRL, conjunctiva/corneas clear, EOM's intact, fundi    benign, both eyes       Ears:    Normal TM's and external ear canals, both ears  Nose:   Nares normal, septum midline, mucosa normal, no drainage    or sinus tenderness  Throat:   Lips, mucosa, and tongue normal; no tooth abscess noted, but pain limits the extent to which patient is able to open his mouth.   Neck:   Supple, symmetrical, trachea midline, no adenopathy;        thyroid:  No enlargement/tenderness/nodules; no carotid   bruit or JVD     Lungs:     Clear to auscultation bilaterally, respirations unlabored     Heart:    Regular rate and rhythm, S1 and S2 normal, no murmur, rub   or gallop  Abdomen:     Soft, non-tender, bowel sounds active all four quadrants,    no masses, no organomegaly        Extremities:   Extremities normal, atraumatic, no cyanosis or edema  Pulses:   2+ and symmetric all extremities  Skin:   Skin color, texture, turgor normal, no rashes or lesions  Lymph nodes:   Cervical, supraclavicular, and axillary nodes normal  Neurologic:    nonfocal      Psychiatric: Normal affect. Cooperative.  Lab results: Basic Metabolic Panel:  Basename 02/12/12 1440  NA 136  K 3.7  CL 100  CO2 28  GLUCOSE 82  BUN 9  CREATININE 0.84  CALCIUM 9.0  MG --  PHOS --   Liver Function Tests: No results found for this basename: AST:2,ALT:2,ALKPHOS:2,BILITOT:2,PROT:2,ALBUMIN:2 in the last 72 hours No results found for this basename: LIPASE:2,AMYLASE:2 in the last 72 hours No results found for this basename: AMMONIA:2 in the last 72 hours CBC:  Basename 02/12/12 1440  WBC 13.7*  NEUTROABS 11.4*  HGB 12.6*  HCT 36.2*  MCV 90.0  PLT 221   Imaging results:  Ct Maxillofacial W/cm  02/12/2012  *RADIOLOGY REPORT*  Clinical Data: Right-sided facial swelling for 4 days, evaluate for abscess  CT MAXILLOFACIAL WITH CONTRAST  Technique:  Multidetector CT imaging of the maxillofacial structures was performed with intravenous contrast. Multiplanar CT image reconstructions were also generated.  Contrast: 75mL OMNIPAQUE IOHEXOL 300 MG/ML  SOLN  Comparison: None.  Findings:  There is asymmetric soft tissue swelling and induration about the right maxilla without associated definable or drainable fluid collection or abscess.  No osteolysis to suggest the presence of osteomyelitis.  Scattered shoddy right-sided cervical lymph nodes with index  right-sided level IIA lymph node measuring 1.1 cm in short axis diameter (image 82, series 2) are favored to be reactive in etiology.  No facial fracture.  Minimal leftward nasal septal deviation. Normal appearance of the bilateral pterygoid plates.  Normal appearance of the mandible and mandibular condyles.  There is minimal polypoid mucosal thickening within the mild bilateral maxillary sinuses as well as within the right sphenoid sinus.  No air fluid levels.  Limited visualization of the bilateral frontal sinuses, ethmoid air cells and mastoid air cells are normal.  Normal appearance of the imaged superior aspect of the cervical spine. Imaged intracranial structures are normal.  IMPRESSION: 1.  Asymmetric soft tissue swelling about the right maxilla without definable/drainable abscess/fluid collection.  2. Shoddy right-sided cervical lymph nodes  are presumably reactive in etiology.   Original Report Authenticated By: Waynard Reeds, M.D.     Assessment & Plan: Principal Problem:  *Facial cellulitis, likely MRSA. Will admit. IV vancomycin. Pain medication and supportive care. Active Problems:  Tobacco abuse. Will place nicotine patch.  Helen Cuff L 02/12/2012, 6:28 PM

## 2012-02-12 NOTE — ED Provider Notes (Signed)
Medical screening examination/treatment/procedure(s) were performed by non-physician practitioner and as supervising physician I was immediately available for consultation/collaboration.  Charles B. Bernette Mayers, MD 02/12/12 1349

## 2012-02-12 NOTE — Progress Notes (Signed)
ANTIBIOTIC CONSULT NOTE - INITIAL  Pharmacy Consult for Vancomycin Indication: cellulitis  Allergies  Allergen Reactions  . Ms Contin (Morphine Sulfate) Other (See Comments)    Makes my head feel funny   . Penicillins Other (See Comments)    From childhood  . Tramadol Other (See Comments)    Feels clammy  . Erythrocin Palpitations  . Ketorolac Tromethamine Anxiety    Patient Measurements: Height: 6\' 5"  (195.6 cm) Weight: 187 lb 2.7 oz (84.9 kg) IBW/kg (Calculated) : 89.1   Vital Signs: Temp: 98.1 F (36.7 C) (09/30 1822) Temp src: Oral (09/30 1822) BP: 120/80 mmHg (09/30 1822) Pulse Rate: 59  (09/30 1822) Intake/Output from previous day:   Intake/Output from this shift:    Labs:  Basename 02/12/12 1440  WBC 13.7*  HGB 12.6*  PLT 221  LABCREA --  CREATININE 0.84   Estimated Creatinine Clearance: 155.8 ml/min (by C-G formula based on Cr of 0.84). No results found for this basename: VANCOTROUGH:2,VANCOPEAK:2,VANCORANDOM:2,GENTTROUGH:2,GENTPEAK:2,GENTRANDOM:2,TOBRATROUGH:2,TOBRAPEAK:2,TOBRARND:2,AMIKACINPEAK:2,AMIKACINTROU:2,AMIKACIN:2, in the last 72 hours   Microbiology: No results found for this or any previous visit (from the past 720 hour(s)).  Medical History: Past Medical History  Diagnosis Date  . Back pain   . Headache     occ headaches  . GERD (gastroesophageal reflux disease)    Medications:  Scheduled:    . docusate sodium  100 mg Oral BID  . enoxaparin (LOVENOX) injection  40 mg Subcutaneous Q24H  . HYDROmorphone  1 mg Intravenous Once  . HYDROmorphone  1 mg Intravenous Once  . HYDROmorphone  1 mg Intravenous Once  . ibuprofen  600 mg Oral TID WC  . nicotine  21 mg Transdermal Daily  . ondansetron  4 mg Intravenous Once  . sodium chloride  3 mL Intravenous Q12H  . vancomycin  1,000 mg Intravenous Once  . vancomycin  1,000 mg Intravenous Q12H   Assessment: 31yo male with cellulitis of face.  Good renal fxn. Estimated Creatinine  Clearance: 155.8 ml/min (by C-G formula based on Cr of 0.84).  Goal of Therapy:  Vancomycin trough level 10-15 mcg/ml  Plan: Vancomycin 1gm iv q12hrs Check trough at steady state Monitor labs, renal fxn, and cultures per protocol  Valrie Hart A 02/12/2012,6:34 PM

## 2012-02-12 NOTE — ED Provider Notes (Addendum)
History  This chart was scribed for Donnetta Hutching, MD by Ardeen Jourdain. This patient was seen in room APAH4/APAH4 and the patient's care was started at 1313.  CSN: [id number]  Arrival date & time 02/12/12  1249   First MD Initiated Contact with Patient 02/12/12 1313      Chief Complaint  Patient presents with  . Abscess    The history is provided by the patient. No language interpreter was used.   Ryan Valentine is a 31 y.o. male who presents to the Emergency Department complaining of an abscess to the right cheek that is worsening. In the past 4 days has been to the ED at [facility] 2 times and [facility] once for the abscess. He states the abscess has I&D 2 times, and it is still swelling into eye and throat, He denies dental pain or fever. He states he is on multiple antibiotics with no relief. He denies DM or any other chronic health problem.   Past Medical History  Diagnosis Date  . Back pain   . Headache     occ headaches  . GERD (gastroesophageal reflux disease)     Past Surgical History  Procedure Date  . Knee arthroscopy   . Lumbar laminectomy/decompression microdiscectomy 06/05/2011    Procedure: LUMBAR LAMINECTOMY/DECOMPRESSION MICRODISCECTOMY;  Surgeon: Temple Pacini, MD;  Location: [facility];  Service: Neurosurgery;  Laterality: Right;  Right Lumbar five-Sacral one laminectomy/microdiscectomy  . Back surgery     Family History  Problem Relation Age of Onset  . Diabetes Mother   . Hypertension Mother   . Diabetes Father     History  Substance Use Topics  . Smoking status: Current Every Day Smoker -- 1.0 packs/day  . Smokeless tobacco: Not on file  . Alcohol Use: 0.0 oz/week    0-1 Glasses of wine per week      Review of Systems  A complete 10 system review of systems was obtained and all systems are negative except as noted in the HPI and PMH.    Allergies  Ms contin; Penicillins; Tramadol; Erythrocin; and Ketorolac tromethamine  Home  Medications   Current Outpatient Rx  Name Route Sig Dispense Refill  . DOXYCYCLINE HYCLATE 100 MG PO TABS Oral Take 100 mg by mouth 2 (two) times daily.    . IBUPROFEN 800 MG PO TABS Oral Take 1 tablet (800 mg total) by mouth 3 (three) times daily. 21 tablet 0  . OXYCODONE-ACETAMINOPHEN 10-325 MG PO TABS Oral Take 1 tablet by mouth every 4 (four) hours as needed for pain. 30 tablet 0  . SULFAMETHOXAZOLE-TRIMETHOPRIM 800-160 MG PO TABS Oral Take 1 tablet by mouth 2 (two) times daily. 20 tablet 0    Triage Vitals: BP 149/90  Pulse 94  Temp 98.4 F (36.9 C) (Oral)  Resp 20  Ht 6\' 5"  (1.956 m)  Wt 205 lb (92.987 kg)  BMI 24.31 kg/m2  SpO2 100%  Physical Exam  Nursing note and vitals reviewed. Constitutional: He is oriented to person, place, and time. He appears well-developed and well-nourished.  HENT:  Head: Normocephalic and atraumatic.  Eyes: Conjunctivae normal and EOM are normal. Pupils are equal, round, and reactive to light.  Neck: Normal range of motion. Neck supple.  Cardiovascular: Normal rate, regular rhythm and normal heart sounds.   Pulmonary/Chest: Effort normal and breath sounds normal.  Abdominal: Soft. Bowel sounds are normal.  Musculoskeletal: Normal range of motion.  Neurological: He is alert and oriented to person,  place, and time.  Skin: Skin is warm and dry. There is erythema.       Erythema on right cheek, 7 cm in diameter, I&D 4mm incision in central area, indurated   Psychiatric: He has a normal mood and affect.    ED Course  Procedures (including critical care time)  DIAGNOSTIC STUDIES: Oxygen Saturation is 100% on room air, normal by my interpretation.    COORDINATION OF CARE:  1415- Discussed treatment plan with pt at bedside and pt agreed to plan. IV antibiotics ordered and possible admittance discussed.   1500- Medication Orders- ondansetron (ZOFRAN) injection 4 mg Once; HYDROmorphone (DILAUDID) injection 1 mg Once;  vancomycin (VANCOCIN) IVPB  1000 mg/200 mL premix Once   1530- Medication Orders- HYDROmorphone (DILAUDID) injection 1 mg Once      Results for orders placed during the hospital encounter of 02/12/12  CBC WITH DIFFERENTIAL      Component Value Range   WBC 13.7 (*) 4.0 - 10.5 K/uL   RBC 4.02 (*) 4.22 - 5.81 MIL/uL   Hemoglobin 12.6 (*) 13.0 - 17.0 g/dL   HCT 16.1 (*) 09.6 - 04.5 %   MCV 90.0  78.0 - 100.0 fL   MCH 31.3  26.0 - 34.0 pg   MCHC 34.8  30.0 - 36.0 g/dL   RDW 40.9  81.1 - 91.4 %   Platelets 221  150 - 400 K/uL   Neutrophils Relative 83 (*) 43 - 77 %   Neutro Abs 11.4 (*) 1.7 - 7.7 K/uL   Lymphocytes Relative 11 (*) 12 - 46 %   Lymphs Abs 1.5  0.7 - 4.0 K/uL   Monocytes Relative 5  3 - 12 %   Monocytes Absolute 0.7  0.1 - 1.0 K/uL   Eosinophils Relative 1  0 - 5 %   Eosinophils Absolute 0.1  0.0 - 0.7 K/uL   Basophils Relative 0  0 - 1 %   Basophils Absolute 0.0  0.0 - 0.1 K/uL  BASIC METABOLIC PANEL      Component Value Range   Sodium 136  135 - 145 mEq/L   Potassium 3.7  3.5 - 5.1 mEq/L   Chloride 100  96 - 112 mEq/L   CO2 28  19 - 32 mEq/L   Glucose, Bld 82  70 - 99 mg/dL   BUN 9  6 - 23 mg/dL   Creatinine, Ser 7.82  0.50 - 1.35 mg/dL   Calcium 9.0  8.4 - 95.6 mg/dL   GFR calc non Af Amer >90  >90 mL/min   GFR calc Af Amer >90  >90 mL/min    No results found.   No diagnosis found.Ct Maxillofacial W/cm  02/12/2012  *RADIOLOGY REPORT*  Clinical Data: Right-sided facial swelling for 4 days, evaluate for abscess  CT MAXILLOFACIAL WITH CONTRAST  Technique:  Multidetector CT imaging of the maxillofacial structures was performed with intravenous contrast. Multiplanar CT image reconstructions were also generated.  Contrast: 75mL OMNIPAQUE IOHEXOL 300 MG/ML  SOLN  Comparison: None.  Findings:  There is asymmetric soft tissue swelling and induration about the right maxilla without associated definable or drainable fluid collection or abscess.  No osteolysis to suggest the presence of  osteomyelitis.  Scattered shoddy right-sided cervical lymph nodes with index right-sided level IIA lymph node measuring 1.1 cm in short axis diameter (image 82, series 2) are favored to be reactive in etiology.  No facial fracture.  Minimal leftward nasal septal deviation. Normal appearance of the bilateral pterygoid  plates.  Normal appearance of the mandible and mandibular condyles.  There is minimal polypoid mucosal thickening within the mild bilateral maxillary sinuses as well as within the right sphenoid sinus.  No air fluid levels.  Limited visualization of the bilateral frontal sinuses, ethmoid air cells and mastoid air cells are normal.  Normal appearance of the imaged superior aspect of the cervical spine. Imaged intracranial structures are normal.  IMPRESSION: 1.  Asymmetric soft tissue swelling about the right maxilla without definable/drainable abscess/fluid collection.  2. Shoddy right-sided cervical lymph nodes are presumably reactive in etiology.   Original Report Authenticated By: Waynard Reeds, M.D.     MDM  I personally performed the services described in this documentation, which was scribed in my presence. The recorded information has been reviewed and considered.   Fourth ER visit for right facial cellulitis.  I and D. X2.   Glucose normal.   IV vancomycin. Discussed with hospitalist. Admit.   Maxillofacial CT scan shows no obvious pus pocket.      Donnetta Hutching, MD 02/12/12 1553  Donnetta Hutching, MD 02/12/12 [phone]

## 2012-02-12 NOTE — ED Notes (Signed)
Abscess to rt side of face for 4 days, seen here x2 and at [facility] ER last pm  I and D done Lg amt of swelling to face.  Alert, talking, Says he is taking antibiotics.

## 2012-02-13 MED ORDER — TRAZODONE HCL 50 MG PO TABS
50.0000 mg | ORAL_TABLET | Freq: Every evening | ORAL | Status: DC | PRN
  Administered 2012-02-13: 50 mg via ORAL
  Filled 2012-02-13: qty 1

## 2012-02-13 NOTE — Care Management Note (Unsigned)
    Page 1 of 1   02/13/2012     2:06:18 PM   CARE MANAGEMENT NOTE 02/13/2012  Patient:  Ryan Valentine, Ryan Valentine   Account Number:  [id number]  Date Initiated:  02/13/2012  Documentation initiated by:  Rosemary Holms  Subjective/Objective Assessment:   Pt admitted with cellulitis of face. Per pt, much improved today. Hopes to go home tomorrow.     Action/Plan:   DC home with no HH needs identified   Anticipated DC Date:  02/14/2012   Anticipated DC Plan:  HOME/SELF CARE      DC Planning Services  CM consult      Choice offered to / List presented to:             Status of service:  In process, will continue to follow Medicare Important Message given?   (If response is "NO", the following Medicare IM given date fields will be blank) Date Medicare IM given:   Date Additional Medicare IM given:    Discharge Disposition:    Per UR Regulation:    If discussed at Long Length of Stay Meetings, dates discussed:    Comments:  02/13/12 1400 Shamal Stracener Leanord Hawking RN BSN CM

## 2012-02-13 NOTE — Progress Notes (Signed)
Subjective: Face and neck still hurt.  Objective: Vital signs in last 24 hours: Filed Vitals:   02/12/12 1722 02/12/12 1822 02/12/12 2100 02/13/12 0500  BP: 119/68 120/80 115/68 109/66  Pulse: 67 59 85 69  Temp:  98.1 F (36.7 C) 98.4 F (36.9 C) 98.2 F (36.8 C)  TempSrc:  Oral Oral Oral  Resp: 14 20 20 20   Height:  6\' 5"  (1.956 m)    Weight:  84.9 kg (187 lb 2.7 oz)    SpO2: 99% 100% 99% 98%   Weight change:  No intake or output data in the 24 hours ending 02/13/12 1104  Asleep. Arousable. HEENT: Right face much less swollen. Still erythematous, indurated and tender. No drainage. Neck slight erythema on the right.  Lab Results: Basic Metabolic Panel:  Lab 02/12/12 4540  NA 136  K 3.7  CL 100  CO2 28  GLUCOSE 82  BUN 9  CREATININE 0.84  CALCIUM 9.0  MG --  PHOS --   Liver Function Tests: No results found for this basename: AST:2,ALT:2,ALKPHOS:2,BILITOT:2,PROT:2,ALBUMIN:2 in the last 168 hours No results found for this basename: LIPASE:2,AMYLASE:2 in the last 168 hours No results found for this basename: AMMONIA:2 in the last 168 hours CBC:  Lab 02/12/12 1440  WBC 13.7*  NEUTROABS 11.4*  HGB 12.6*  HCT 36.2*  MCV 90.0  PLT 221   Micro Results: No results found for this or any previous visit (from the past 240 hour(s)). Studies/Results: Ct Maxillofacial W/cm  02/12/2012  *RADIOLOGY REPORT*  Clinical Data: Right-sided facial swelling for 4 days, evaluate for abscess  CT MAXILLOFACIAL WITH CONTRAST  Technique:  Multidetector CT imaging of the maxillofacial structures was performed with intravenous contrast. Multiplanar CT image reconstructions were also generated.  Contrast: 75mL OMNIPAQUE IOHEXOL 300 MG/ML  SOLN  Comparison: None.  Findings:  There is asymmetric soft tissue swelling and induration about the right maxilla without associated definable or drainable fluid collection or abscess.  No osteolysis to suggest the presence of osteomyelitis.  Scattered  shoddy right-sided cervical lymph nodes with index right-sided level IIA lymph node measuring 1.1 cm in short axis diameter (image 82, series 2) are favored to be reactive in etiology.  No facial fracture.  Minimal leftward nasal septal deviation. Normal appearance of the bilateral pterygoid plates.  Normal appearance of the mandible and mandibular condyles.  There is minimal polypoid mucosal thickening within the mild bilateral maxillary sinuses as well as within the right sphenoid sinus.  No air fluid levels.  Limited visualization of the bilateral frontal sinuses, ethmoid air cells and mastoid air cells are normal.  Normal appearance of the imaged superior aspect of the cervical spine. Imaged intracranial structures are normal.  IMPRESSION: 1.  Asymmetric soft tissue swelling about the right maxilla without definable/drainable abscess/fluid collection.  2. Shoddy right-sided cervical lymph nodes are presumably reactive in etiology.   Original Report Authenticated By: Waynard Reeds, M.D.    Scheduled Meds:   . docusate sodium  100 mg Oral BID  . enoxaparin (LOVENOX) injection  40 mg Subcutaneous Q24H  . HYDROmorphone  1 mg Intravenous Once  . HYDROmorphone  1 mg Intravenous Once  . HYDROmorphone  1 mg Intravenous Once  . ibuprofen  600 mg Oral TID WC  . nicotine  21 mg Transdermal Daily  . ondansetron  4 mg Intravenous Once  . sodium chloride  3 mL Intravenous Q12H  . vancomycin  1,000 mg Intravenous Once  . vancomycin  1,000 mg  Intravenous Q12H   Continuous Infusions:  PRN Meds:.sodium chloride, HYDROmorphone (DILAUDID) injection, iohexol, magnesium hydroxide, oxyCODONE, oxyCODONE-acetaminophen, sodium chloride, traZODone, DISCONTD: oxyCODONE-acetaminophen Assessment/Plan: Principal Problem:  *Facial cellulitis Active Problems:  Tobacco abuse  Much improved today. Continue vancomycin. May be able to go in a day or 2 if progress remains good.   LOS: 1 day   Cheril Slattery  L 02/13/2012, 11:04 AM

## 2012-02-14 MED ORDER — HYDROMORPHONE HCL 2 MG PO TABS: 2.0000 mg | ORAL_TABLET | ORAL | Status: DC | PRN

## 2012-02-14 NOTE — Discharge Summary (Signed)
Physician Discharge Summary  Patient ID: Ryan Valentine MRN: [id number] DOB/AGE: 1980-09-19 31 y.o.  Admit date: 02/12/2012 Discharge date: 02/14/2012  Discharge Diagnoses:  Principal Problem:  *Facial cellulitis Active Problems:  Tobacco abuse     Medication List     As of 02/14/2012  8:22 AM    STOP taking these medications         doxycycline 100 MG tablet   Commonly known as: VIBRA-TABS      oxyCODONE-acetaminophen 10-325 MG per tablet   Commonly known as: PERCOCET      TAKE these medications         HYDROmorphone 2 MG tablet   Commonly known as: DILAUDID   Take 1-2 tablets (2-4 mg total) by mouth every 4 (four) hours as needed for pain.      ibuprofen 800 MG tablet   Commonly known as: ADVIL,MOTRIN   Take 1 tablet (800 mg total) by mouth 3 (three) times daily.      sulfamethoxazole-trimethoprim 800-160 MG per tablet   Commonly known as: BACTRIM DS,SEPTRA DS   Take 1 tablet by mouth 2 (two) times daily.            Discharge Orders    Future Orders Please Complete By Expires   Discharge instructions      Comments:   Apply warm compress to cheek until no further drainage   Activity as tolerated - No restrictions      Driving Restrictions      Comments:   No driving while on pain medication.   Discharge wound care:      Comments:   May cover incision with gauze and change as needed   May shower / Bathe         Follow-up Information    Follow up with Selinda Flavin, MD. (If symptoms worsen)    Contact information:   [address] [place] [address] [phone]          Disposition: 01-Home or Self Care  Discharged Condition: stable  Consults:  none  Labs:   Results for orders placed during the hospital encounter of 02/12/12 (from the past 48 hour(s))  CBC WITH DIFFERENTIAL     Status: Abnormal   Collection Time   02/12/12  2:40 PM      Component Value Range Comment   WBC 13.7 (*) 4.0 - 10.5 K/uL    RBC 4.02 (*) 4.22 - 5.81 MIL/uL    Hemoglobin 12.6 (*) 13.0 - 17.0 g/dL    HCT 21.3 (*) 08.6 - 52.0 %    MCV 90.0  78.0 - 100.0 fL    MCH 31.3  26.0 - 34.0 pg    MCHC 34.8  30.0 - 36.0 g/dL    RDW 57.8  46.9 - 62.9 %    Platelets 221  150 - 400 K/uL    Neutrophils Relative 83 (*) 43 - 77 %    Neutro Abs 11.4 (*) 1.7 - 7.7 K/uL    Lymphocytes Relative 11 (*) 12 - 46 %    Lymphs Abs 1.5  0.7 - 4.0 K/uL    Monocytes Relative 5  3 - 12 %    Monocytes Absolute 0.7  0.1 - 1.0 K/uL    Eosinophils Relative 1  0 - 5 %    Eosinophils Absolute 0.1  0.0 - 0.7 K/uL    Basophils Relative 0  0 - 1 %    Basophils Absolute 0.0  0.0 - 0.1  K/uL   BASIC METABOLIC PANEL     Status: Normal   Collection Time   02/12/12  2:40 PM      Component Value Range Comment   Sodium 136  135 - 145 mEq/L    Potassium 3.7  3.5 - 5.1 mEq/L    Chloride 100  96 - 112 mEq/L    CO2 28  19 - 32 mEq/L    Glucose, Bld 82  70 - 99 mg/dL    BUN 9  6 - 23 mg/dL    Creatinine, Ser 1.61  0.50 - 1.35 mg/dL    Calcium 9.0  8.4 - 09.6 mg/dL    GFR calc non Af Amer >90  >90 mL/min    GFR calc Af Amer >90  >90 mL/min     Diagnostics:  Ct Maxillofacial W/cm  02/12/2012  *RADIOLOGY REPORT*  Clinical Data: Right-sided facial swelling for 4 days, evaluate for abscess  CT MAXILLOFACIAL WITH CONTRAST  Technique:  Multidetector CT imaging of the maxillofacial structures was performed with intravenous contrast. Multiplanar CT image reconstructions were also generated.  Contrast: 75mL OMNIPAQUE IOHEXOL 300 MG/ML  SOLN  Comparison: None.  Findings:  There is asymmetric soft tissue swelling and induration about the right maxilla without associated definable or drainable fluid collection or abscess.  No osteolysis to suggest the presence of osteomyelitis.  Scattered shoddy right-sided cervical lymph nodes with index right-sided level IIA lymph node measuring 1.1 cm in short axis diameter (image 82, series 2) are favored to be reactive in etiology.  No facial fracture.  Minimal  leftward nasal septal deviation. Normal appearance of the bilateral pterygoid plates.  Normal appearance of the mandible and mandibular condyles.  There is minimal polypoid mucosal thickening within the mild bilateral maxillary sinuses as well as within the right sphenoid sinus.  No air fluid levels.  Limited visualization of the bilateral frontal sinuses, ethmoid air cells and mastoid air cells are normal.  Normal appearance of the imaged superior aspect of the cervical spine. Imaged intracranial structures are normal.  IMPRESSION: 1.  Asymmetric soft tissue swelling about the right maxilla without definable/drainable abscess/fluid collection.  2. Shoddy right-sided cervical lymph nodes are presumably reactive in etiology.   Original Report Authenticated By: Waynard Reeds, M.D.     Procedures: none  Full Code   Hospital Course: See H&P for complete admission details.  The patient is a 31 year old white male who presents with worsening cellulitis involving the right cheek and face. He had had incision and drainage prior to admission. He had taken some of his grandmothers doxycycline. He was given a prescription for Septra, then clindamycin. His pain and cellulitis continued to worsen. In the emergency room, he was afebrile. He had a white blood cell count of 13,000. CT of the maxillofacial structures showed no underlying drainable abscess. He has had several family members with MRSA infections. He was admitted and started on vancomycin. His cellulitis improved. Pain control remained challenging. His incision has started to drain again and I have recommended warm compresses. He still has some swelling, but the erythema is much improved. He may continue the Bactrim as it should cover MRSA. He may follow up with his primary care physician for worsening infection. He was encouraged to quit smoking.  Discharge Exam:  Blood pressure 119/72, pulse 50, temperature 97.6 F (36.4 C), temperature source Oral,  resp. rate 16, height 6\' 5"  (1.956 m), weight 84.9 kg (187 lb 2.7 oz), SpO2 98.00%.  Facial erythema, edema continues to improve.  Dried drainage noted from incision  Signed: Icie Kuznicki L 02/14/2012, 8:22 AM

## 2012-02-14 NOTE — Progress Notes (Signed)
[facility] SURGICAL UNIT [id number] [place] [address] Phone: [phone] Fax: [phone]  February 14, 2012  Patient: Ryan Valentine  Date of Birth: [birth year]  Date of Visit: 02/12/2012    To Whom It May Concern:  [name] was seen and treated in our hospital on 02/12/2012. [name]  may return to work on 02/19/12.  Sincerely,     Crista Curb, M.D.

## 2012-02-14 NOTE — Progress Notes (Signed)
IV removed, site WNL.  Pt given d/c instructions and new prescriptions.  Discussed home care with patient and discussed home medications, patient verbalizes understanding, teachback completed. F/U appointment to be made if symptoms worsen, pt aware, pt states they will keep appointment. Pt is stable at this time. Pt taken to main entrance, refused wheelchair, by staff member.

## 2012-02-15 NOTE — Progress Notes (Signed)
UR Chart Review Completed  

## 2012-03-25 ENCOUNTER — Encounter (HOSPITAL_COMMUNITY): Payer: Self-pay | Admitting: Emergency Medicine

## 2012-03-25 ENCOUNTER — Emergency Department (HOSPITAL_COMMUNITY)
Admission: EM | Admit: 2012-03-25 | Discharge: 2012-03-25 | Disposition: A | Discharge status: Home / Self Care | Payer: Self-pay | Attending: Emergency Medicine | Admitting: Emergency Medicine

## 2012-03-25 DIAGNOSIS — F17200 Nicotine dependence, unspecified, uncomplicated: Secondary | ICD-10-CM | POA: Insufficient documentation

## 2012-03-25 DIAGNOSIS — K0889 Other specified disorders of teeth and supporting structures: Secondary | ICD-10-CM

## 2012-03-25 DIAGNOSIS — K089 Disorder of teeth and supporting structures, unspecified: Secondary | ICD-10-CM | POA: Insufficient documentation

## 2012-03-25 DIAGNOSIS — K219 Gastro-esophageal reflux disease without esophagitis: Secondary | ICD-10-CM | POA: Insufficient documentation

## 2012-03-25 DIAGNOSIS — Z79899 Other long term (current) drug therapy: Secondary | ICD-10-CM | POA: Insufficient documentation

## 2012-03-25 MED ORDER — DIAZEPAM 2 MG PO TABS: ORAL_TABLET | ORAL | Status: DC

## 2012-03-25 MED ORDER — HYDROCODONE-ACETAMINOPHEN 5-325 MG PO TABS: 1.0000 | ORAL_TABLET | Freq: Four times a day (QID) | ORAL | Status: AC | PRN

## 2012-03-25 MED ORDER — CLINDAMYCIN HCL 150 MG PO CAPS: 300.0000 mg | ORAL_CAPSULE | Freq: Three times a day (TID) | ORAL | Status: DC

## 2012-03-25 MED ORDER — HYDROCODONE-ACETAMINOPHEN 5-325 MG PO TABS
1.0000 | ORAL_TABLET | Freq: Once | ORAL | Status: AC
  Administered 2012-03-25: 1 via ORAL
  Filled 2012-03-25: qty 1

## 2012-03-25 MED ORDER — CLINDAMYCIN HCL 150 MG PO CAPS
300.0000 mg | ORAL_CAPSULE | Freq: Once | ORAL | Status: AC
  Administered 2012-03-25: 300 mg via ORAL
  Filled 2012-03-25: qty 2

## 2012-03-25 NOTE — ED Provider Notes (Signed)
History     CSN: [id number]  Arrival date & time 03/25/12  1429   None     Chief Complaint  Patient presents with  . Dental Pain    (Consider location/radiation/quality/duration/timing/severity/associated sxs/prior treatment) HPI Comments: Pt states he has had the same L upper molar filled 3 times and the filling keeps falling out.  The last time was ~ 1 month ago.  Patient is a 31 y.o. male presenting with tooth pain. The history is provided by the patient. No language interpreter was used.  Dental PainThe primary symptoms include mouth pain. Primary symptoms do not include dental injury or fever. Episode onset: several days ago. The symptoms are unchanged.    Past Medical History  Diagnosis Date  . Back pain   . Headache     occ headaches  . GERD (gastroesophageal reflux disease)     Past Surgical History  Procedure Date  . Knee arthroscopy   . Lumbar laminectomy/decompression microdiscectomy 06/05/2011    Procedure: LUMBAR LAMINECTOMY/DECOMPRESSION MICRODISCECTOMY;  Surgeon: Temple Pacini, MD;  Location: [facility];  Service: Neurosurgery;  Laterality: Right;  Right Lumbar five-Sacral one laminectomy/microdiscectomy  . Back surgery     Family History  Problem Relation Age of Onset  . Diabetes Mother   . Hypertension Mother   . Diabetes Father     History  Substance Use Topics  . Smoking status: Current Every Day Smoker -- 1.0 packs/day  . Smokeless tobacco: Not on file  . Alcohol Use: 0.0 oz/week    0-1 Glasses of wine per week      Review of Systems  Constitutional: Negative for fever and chills.  HENT: Positive for dental problem.   Cardiovascular: Negative for chest pain.  All other systems reviewed and are negative.    Allergies  Ms contin; Penicillins; Tramadol; Erythrocin; and Ketorolac tromethamine  Home Medications   Current Outpatient Rx  Name  Route  Sig  Dispense  Refill  . ACETAMINOPHEN 500 MG PO TABS   Oral   Take 500-1,000 mg by  mouth once as needed. For dental pain         . CLINDAMYCIN HCL 150 MG PO CAPS   Oral   Take 2 capsules (300 mg total) by mouth 3 (three) times daily.   60 capsule   0   . DIAZEPAM 2 MG PO TABS      One po QHS   8 tablet   0   . HYDROCODONE-ACETAMINOPHEN 5-325 MG PO TABS   Oral   Take 1 tablet by mouth every 6 (six) hours as needed for pain.   20 tablet   0     BP 119/69  Pulse 86  Temp 98.3 F (36.8 C) (Oral)  Resp 18  Ht 6\' 5"  (1.956 m)  Wt 200 lb (90.719 kg)  BMI 23.72 kg/m2  SpO2 96%  Physical Exam  Nursing note and vitals reviewed. Constitutional: He is oriented to person, place, and time. He appears well-developed and well-nourished.  HENT:  Head: Normocephalic and atraumatic.  Mouth/Throat: No uvula swelling or dental caries.    Eyes: EOM are normal.  Neck: Normal range of motion.  Cardiovascular: Normal rate, regular rhythm and intact distal pulses.   Pulmonary/Chest: Effort normal. No respiratory distress.  Abdominal: Soft. He exhibits no distension. There is no tenderness.  Musculoskeletal: Normal range of motion.  Neurological: He is alert and oriented to person, place, and time.  Skin: Skin is warm and dry.  Psychiatric: He has a normal mood and affect. Judgment normal.    ED Course  Procedures (including critical care time)  Labs Reviewed - No data to display No results found.   1. Pain, dental       MDM  rx-hydrocodone, 20 rx-clindamycin 300 mg TID x 10 days.  F/u with your dentist.        Evalina Field, PA 03/25/12 1623  Evalina Field, [place] 03/25/12 [phone]

## 2012-03-25 NOTE — ED Notes (Signed)
Pt states filing came out of tooth. Pt states Dr Lamont Dowdy is dentist but couldn't be seen for a week.

## 2012-03-26 NOTE — ED Provider Notes (Signed)
Medical screening examination/treatment/procedure(s) were performed by non-physician practitioner and as supervising physician I was immediately available for consultation/collaboration.   Glynn Octave, MD 03/26/12 [phone]

## 2012-04-23 ENCOUNTER — Emergency Department (HOSPITAL_COMMUNITY)
Admission: EM | Admit: 2012-04-23 | Discharge: 2012-04-23 | Disposition: A | Discharge status: Home / Self Care | Payer: Self-pay | Attending: Emergency Medicine | Admitting: Emergency Medicine

## 2012-04-23 ENCOUNTER — Encounter (HOSPITAL_COMMUNITY): Payer: Self-pay

## 2012-04-23 DIAGNOSIS — G8929 Other chronic pain: Secondary | ICD-10-CM | POA: Insufficient documentation

## 2012-04-23 DIAGNOSIS — Z9889 Other specified postprocedural states: Secondary | ICD-10-CM | POA: Insufficient documentation

## 2012-04-23 DIAGNOSIS — M5380 Other specified dorsopathies, site unspecified: Secondary | ICD-10-CM | POA: Insufficient documentation

## 2012-04-23 DIAGNOSIS — Z8719 Personal history of other diseases of the digestive system: Secondary | ICD-10-CM | POA: Insufficient documentation

## 2012-04-23 DIAGNOSIS — M549 Dorsalgia, unspecified: Secondary | ICD-10-CM

## 2012-04-23 DIAGNOSIS — M545 Low back pain, unspecified: Secondary | ICD-10-CM | POA: Insufficient documentation

## 2012-04-23 DIAGNOSIS — M79609 Pain in unspecified limb: Secondary | ICD-10-CM | POA: Insufficient documentation

## 2012-04-23 DIAGNOSIS — R209 Unspecified disturbances of skin sensation: Secondary | ICD-10-CM | POA: Insufficient documentation

## 2012-04-23 DIAGNOSIS — F17200 Nicotine dependence, unspecified, uncomplicated: Secondary | ICD-10-CM | POA: Insufficient documentation

## 2012-04-23 MED ORDER — OXYCODONE-ACETAMINOPHEN 5-325 MG PO TABS
2.0000 | ORAL_TABLET | Freq: Once | ORAL | Status: AC
  Administered 2012-04-23: 2 via ORAL
  Filled 2012-04-23: qty 2

## 2012-04-23 MED ORDER — IBUPROFEN 800 MG PO TABS
800.0000 mg | ORAL_TABLET | Freq: Once | ORAL | Status: AC
  Administered 2012-04-23: 800 mg via ORAL
  Filled 2012-04-23: qty 1

## 2012-04-23 MED ORDER — OXYCODONE-ACETAMINOPHEN 5-325 MG PO TABS: 2.0000 | ORAL_TABLET | ORAL | Status: DC | PRN

## 2012-04-23 MED ORDER — DIAZEPAM 5 MG PO TABS: 5.0000 mg | ORAL_TABLET | Freq: Two times a day (BID) | ORAL | Status: DC

## 2012-04-23 MED ORDER — DIAZEPAM 5 MG PO TABS
5.0000 mg | ORAL_TABLET | Freq: Once | ORAL | Status: AC
  Administered 2012-04-23: 5 mg via ORAL
  Filled 2012-04-23: qty 1

## 2012-04-23 MED ORDER — METHOCARBAMOL 500 MG PO TABS
1000.0000 mg | ORAL_TABLET | Freq: Once | ORAL | Status: AC
  Administered 2012-04-23: 1000 mg via ORAL
  Filled 2012-04-23: qty 2

## 2012-04-23 NOTE — ED Notes (Signed)
Pt reports low back pain since sat. Has long history of chronic back pain

## 2012-04-23 NOTE — ED Provider Notes (Signed)
History    This chart was scribed for Jones Skene, MD, MD by Smitty Pluck, ED Scribe. The patient was seen in room APA09 and the patient's care was started at 8:50AM.   CSN: [id number]  Arrival date & time 04/23/12  0755      Chief Complaint  Patient presents with  . Back Pain     The history is provided by the patient. No language interpreter was used.   HAWKINS SEAMAN is a 31 y.o. male who presents to the Emergency Department complaining of constant, moderate lower back pain onset 4 days ago. He reports having numbness and pain in right thigh and numbness in right neck. He states that he dropped milk carton today but denies any other weakness. He reports that he has had difficulty emptying bladder while urinating. Back surgery (disc removal) in January 2013. He reports pulling wires at work and pain started shortly afterwards. He has appointment with orthopedist in 2 weeks. Denies chest pain, SOB, fevers, chills, IV drug usage, bowel incontinence and any other pain. He reports movement aggravates the pain.   Past Medical History  Diagnosis Date  . Back pain   . Headache     occ headaches  . GERD (gastroesophageal reflux disease)     Past Surgical History  Procedure Date  . Knee arthroscopy   . Lumbar laminectomy/decompression microdiscectomy 06/05/2011    Procedure: LUMBAR LAMINECTOMY/DECOMPRESSION MICRODISCECTOMY;  Surgeon: Temple Pacini, MD;  Location: [facility];  Service: Neurosurgery;  Laterality: Right;  Right Lumbar five-Sacral one laminectomy/microdiscectomy  . Back surgery     Family History  Problem Relation Age of Onset  . Diabetes Mother   . Hypertension Mother   . Diabetes Father     History  Substance Use Topics  . Smoking status: Current Every Day Smoker -- 1.0 packs/day  . Smokeless tobacco: Not on file  . Alcohol Use: 0.0 oz/week    0-1 Glasses of wine per week      Review of Systems At least 10pt or greater review of systems completed  and are negative except where specified in the HPI.  Allergies  Ms contin; Penicillins; Tramadol; Erythrocin; and Ketorolac tromethamine  Home Medications   Current Outpatient Rx  Name  Route  Sig  Dispense  Refill  . ACETAMINOPHEN 500 MG PO TABS   Oral   Take 500-1,000 mg by mouth once as needed. For dental pain         . CLINDAMYCIN HCL 150 MG PO CAPS   Oral   Take 2 capsules (300 mg total) by mouth 3 (three) times daily.   60 capsule   0   . DIAZEPAM 2 MG PO TABS      One po QHS   8 tablet   0     BP 148/84  Pulse 110  Temp 98.1 F (36.7 C) (Oral)  Resp 20  Ht 6\' 5"  (1.956 m)  Wt 190 lb (86.183 kg)  BMI 22.53 kg/m2  SpO2 99%  Physical Exam  Nursing note and vitals reviewed.   Nursing notes reviewed.  Electronic medical record reviewed. VITAL SIGNS:   Filed Vitals:   04/23/12 0804  BP: 148/84  Pulse: 110  Temp: 98.1 F (36.7 C)  TempSrc: Oral  Resp: 20  Height: 6\' 5"  (1.956 m)  Weight: 190 lb (86.183 kg)  SpO2: 99%   CONSTITUTIONAL: Awake, oriented, appears non-toxic HENT: Atraumatic, normocephalic, oral mucosa pink and moist, airway patent. Nares patent without  drainage. External ears normal. EYES: Conjunctiva clear, EOMI, PERRLA NECK: Trachea midline, non-tender, supple CARDIOVASCULAR: Normal heart rate, Normal rhythm, No murmurs, rubs, gallops PULMONARY/CHEST: Clear to auscultation, no rhonchi, wheezes, or rales. Symmetrical breath sounds. Non-tender. ABDOMINAL: Non-distended, soft, non-tender - no rebound or guarding.  BS normal. NEUROLOGIC: Non-focal, moving all four extremities, no gross sensory or motor deficits. Reflexes are 2+ in the patella tendons, no clonus 1+ Achilles reflex Back: Tenderness to palpation along the right lower paraspinous muscles adjacent to L5-S1. Well-healed old surgical scar in the same region. Patient has palpable spasm above iliac crest EXTREMITIES: No clubbing, cyanosis, or edema SKIN: Warm, Dry, No erythema, No  rash   ED Course  [place] bedside Performed by: Jones Skene Authorized by: Jones Skene Consent: Verbal consent obtained. Comments: Performed a bladder scan using bedside ultrasound-bladder was collapsed, scant fluid seen within it.   (including critical care time) DIAGNOSTIC STUDIES: Oxygen Saturation is 99% on room air, normal by my interpretation.    COORDINATION OF CARE: 8:56 AM Discussed ED treatment with pt  9:31 AM Ordered:    . ibuprofen  800 mg Oral Once  . methocarbamol  1,000 mg Oral Once  . oxyCODONE-acetaminophen  2 tablet Oral Once      Labs Reviewed - No data to display No results found.   1. Chronic back pain       MDM  MARLOS CARMEN is a 31 y.o. male presents with right lower back pain and spasm. Patient has had back surgery for disc problems before and sees Dr. Jordan Likes, has followup with Dr. Jordan Likes in 2 weeks. She also has a history of Percocet abuse and has been selling Percocet in the past. Patient does actually have a palpable spasm likely exacerbated at work while pulling on some cables. Patient has no symptoms or signs consistent with cord compression syndrome. Do not think imaging is indicated at this time. Patient is neurovascularly intact. He has no weakness. Reflexes are intact. We'll treat the patient pain here and give him an extremely short prescription. He is informed he will not receive any further narcotics.  I explained the diagnosis and have given explicit precautions to return to the ER including symptoms consistent with cord compression syndrome or any other new or worsening symptoms. The patient understands and accepts the medical plan as it's been dictated and I have answered their questions. Discharge instructions concerning home care and prescriptions have been given.  The patient is STABLE and is discharged to home in good condition.   I personally performed the services described in this documentation, which was scribed in my  presence. The recorded information has been reviewed and is accurate. Jones Skene, M.D.         Jones Skene, MD 04/23/12 1101

## 2012-05-03 ENCOUNTER — Encounter (HOSPITAL_COMMUNITY): Payer: Self-pay

## 2012-05-03 ENCOUNTER — Emergency Department (HOSPITAL_COMMUNITY)
Admission: EM | Admit: 2012-05-03 | Discharge: 2012-05-03 | Disposition: A | Discharge status: Home / Self Care | Payer: Self-pay | Attending: Emergency Medicine | Admitting: Emergency Medicine

## 2012-05-03 DIAGNOSIS — M545 Low back pain, unspecified: Secondary | ICD-10-CM | POA: Insufficient documentation

## 2012-05-03 DIAGNOSIS — Z79899 Other long term (current) drug therapy: Secondary | ICD-10-CM | POA: Insufficient documentation

## 2012-05-03 DIAGNOSIS — G8929 Other chronic pain: Secondary | ICD-10-CM | POA: Insufficient documentation

## 2012-05-03 DIAGNOSIS — F17200 Nicotine dependence, unspecified, uncomplicated: Secondary | ICD-10-CM | POA: Insufficient documentation

## 2012-05-03 DIAGNOSIS — Z8719 Personal history of other diseases of the digestive system: Secondary | ICD-10-CM | POA: Insufficient documentation

## 2012-05-03 DIAGNOSIS — R209 Unspecified disturbances of skin sensation: Secondary | ICD-10-CM | POA: Insufficient documentation

## 2012-05-03 MED ORDER — OXYCODONE-ACETAMINOPHEN 5-325 MG PO TABS
1.0000 | ORAL_TABLET | Freq: Once | ORAL | Status: AC
  Administered 2012-05-03: 1 via ORAL
  Filled 2012-05-03: qty 1

## 2012-05-03 NOTE — ED Notes (Signed)
In to discharge pt; not found in room.

## 2012-05-03 NOTE — ED Provider Notes (Signed)
History  This chart was scribed for Ryan Gaskins, MD by Ardeen Jourdain, ED Scribe. This patient was seen in room APA11/[facility]11 and the patient's care was started at 0749.  CSN: [id number]  Arrival date & time 05/03/12  4098   First MD Initiated Contact with Patient 05/03/12 [phone]      Chief Complaint  Patient presents with  . Back Pain    Patient is a 31 y.o. male presenting with back pain. The history is provided by the patient. No language interpreter was used.  Back Pain  This is a recurrent problem. The current episode started more than 1 week ago. The problem occurs constantly. The problem has not changed since onset.The pain is associated with twisting. The pain is present in the lumbar spine. The quality of the pain is described as aching. The pain does not radiate. The symptoms are aggravated by bending, twisting and certain positions. The pain is the same all the time. Associated symptoms include numbness and leg pain. Pertinent negatives include no chest pain, no fever, no headaches, no abdominal pain, no bowel incontinence, no bladder incontinence, no dysuria, no pelvic pain, no tingling and no weakness. He has tried nothing for the symptoms.   [name] is a 31 y.o. male who presents to the Emergency Department complaining of lumbar back pain. He states he had surgery (disc removal) on his lumbar spine in January.  He reports believing he re-injured the area but denies trauma.  He reports chronic numbness in right LE, no change in that today  Past Medical History  Diagnosis Date  . Back pain   . Headache     occ headaches  . GERD (gastroesophageal reflux disease)     Past Surgical History  Procedure Date  . Knee arthroscopy   . Lumbar laminectomy/decompression microdiscectomy 06/05/2011    Procedure: LUMBAR LAMINECTOMY/DECOMPRESSION MICRODISCECTOMY;  Surgeon: Temple Pacini, MD;  Location: [facility];  Service: Neurosurgery;  Laterality: Right;  Right Lumbar  five-Sacral one laminectomy/microdiscectomy  . Back surgery     Family History  Problem Relation Age of Onset  . Diabetes Mother   . Hypertension Mother   . Diabetes Father     History  Substance Use Topics  . Smoking status: Current Every Day Smoker -- 1.0 packs/day  . Smokeless tobacco: Not on file  . Alcohol Use: 0.0 oz/week    0-1 Glasses of wine per week      Review of Systems  Constitutional: Negative for fever.  Cardiovascular: Negative for chest pain.  Gastrointestinal: Negative for abdominal pain and bowel incontinence.  Genitourinary: Negative for bladder incontinence, dysuria, difficulty urinating and pelvic pain.  Musculoskeletal: Positive for back pain.  Neurological: Positive for numbness. Negative for tingling, weakness and headaches.  All other systems reviewed and are negative.    Allergies  Ms contin; Penicillins; Tramadol; Erythrocin; and Ketorolac tromethamine  Home Medications   Current Outpatient Rx  Name  Route  Sig  Dispense  Refill  . ACETAMINOPHEN 500 MG PO TABS   Oral   Take 500-1,000 mg by mouth once as needed. For dental pain         . DIAZEPAM 5 MG PO TABS   Oral   Take 1 tablet (5 mg total) by mouth 2 (two) times daily.   5 tablet   0   . NAPROXEN SODIUM 220 MG PO TABS   Oral   Take 440 mg by mouth 2 (two) times daily with a  meal. pain         . OXYCODONE-ACETAMINOPHEN 5-325 MG PO TABS   Oral   Take 2 tablets by mouth every 4 (four) hours as needed for pain.   11 tablet   0     Triage Vitals: BP 135/89  Pulse 77  Temp 97.9 F (36.6 C) (Oral)  Resp 20  Ht 6\' 5"  (1.956 m)  Wt 200 lb (90.719 kg)  BMI 23.72 kg/m2  SpO2 97%  Physical Exam  CONSTITUTIONAL: Well developed/well nourished HEAD AND FACE: Normocephalic/atraumatic EYES: EOMI/PERRL ENMT: Mucous membranes moist NECK: supple no meningeal signs SPINE:lumbar paraspinal tenderness, well healed lumbar scar, No bruising/crepitance/stepoffs noted to  spine CV: S1/S2 noted, no murmurs/rubs/gallops noted LUNGS: Lungs are clear to auscultation bilaterally, no apparent distress ABDOMEN: soft, nontender, no rebound or guarding GU:no cva tenderness NEURO: Awake/alert, equal distal motor: hip flexion/knee flexion/extension, ankle dorsi/plantar flexion, great toe extension intact bilaterally, no clonus bilaterally, plantar reflex appropriate,.  Equal patellar/achilles reflex noted.  Pt is able to ambulate. EXTREMITIES: pulses normal, full ROM SKIN: warm, color normal PSYCH: no abnormalities of mood noted    ED Course  Procedures   DIAGNOSTIC STUDIES: Oxygen Saturation is 97% on room air, normal by my interpretation.    COORDINATION OF CARE:  7:57 AM: Discussed treatment plan which includes pain medication with pt at bedside and pt agreed to plan. Given recurrence of back pain I advised to check urine sample.  He appeared to place water in the cup.  I offered oral pain meds in the ED but he reported it did not work.  He just wants prescription to go home.  I feel this has become a chronic pain for patient.  Advised f/u with his neurosurgeon and pain management.  He left the ED before paperwork could be given    MDM  Nursing notes including past medical history and social history reviewed and considered in documentation Previous records reviewed and considered - recent ED notes reviewed Narcotic database reviewed    I personally performed the services described in this documentation, which was scribed in my presence. The recorded information has been reviewed and is accurate.        Ryan Gaskins, MD 05/03/12 [phone]

## 2012-05-03 NOTE — ED Notes (Signed)
Pt c/o back pain for 1 week. Pt states hard to get out of bed. History of previous surgery.

## 2012-05-03 NOTE — ED Notes (Signed)
Medicated for pain and in to collect urine specimen; specimen appears to be water - pt states he is insulted that it was suggested by the EDP that he put water in the cup.  Fluid in cup pH tested with no change. EDP made aware.

## 2012-05-04 ENCOUNTER — Emergency Department (HOSPITAL_COMMUNITY)
Admission: EM | Admit: 2012-05-04 | Discharge: 2012-05-04 | Disposition: A | Discharge status: Home / Self Care | Payer: Self-pay | Attending: Emergency Medicine | Admitting: Emergency Medicine

## 2012-05-04 ENCOUNTER — Encounter (HOSPITAL_COMMUNITY): Payer: Self-pay | Admitting: *Deleted

## 2012-05-04 DIAGNOSIS — F17200 Nicotine dependence, unspecified, uncomplicated: Secondary | ICD-10-CM | POA: Insufficient documentation

## 2012-05-04 DIAGNOSIS — G8929 Other chronic pain: Secondary | ICD-10-CM | POA: Insufficient documentation

## 2012-05-04 DIAGNOSIS — M549 Dorsalgia, unspecified: Secondary | ICD-10-CM | POA: Insufficient documentation

## 2012-05-04 DIAGNOSIS — Z8719 Personal history of other diseases of the digestive system: Secondary | ICD-10-CM | POA: Insufficient documentation

## 2012-05-04 DIAGNOSIS — Z9889 Other specified postprocedural states: Secondary | ICD-10-CM | POA: Insufficient documentation

## 2012-05-04 MED ORDER — OXYCODONE-ACETAMINOPHEN 5-325 MG PO TABS
2.0000 | ORAL_TABLET | Freq: Once | ORAL | Status: AC
  Administered 2012-05-04: 2 via ORAL
  Filled 2012-05-04: qty 2

## 2012-05-04 MED ORDER — OXYCODONE-ACETAMINOPHEN 5-325 MG PO TABS: 1.0000 | ORAL_TABLET | Freq: Four times a day (QID) | ORAL | Status: DC | PRN

## 2012-05-04 NOTE — ED Provider Notes (Signed)
History     CSN: [id number]  Arrival date & time 05/04/12  0303   First MD Initiated Contact with Patient 05/04/12 0341      Chief Complaint  Patient presents with  . Back Pain    (Consider location/radiation/quality/duration/timing/severity/associated sxs/prior treatment) HPIMichael T Valentine is a 31 y.o. male  With a h/o chronic back pain who presents to the Emergency Department complaining of increased back pain over the last few days. He was seen in the ER yesterday for the same. He had surgery in January (discectomy)and hs had increasing back pain over the last two months. He is to see his neurosurgeon after the first of the year for additional MRI and possible surgery. Back pain does not radiate however he has had numbness in the right leg since surgery which is unchanged. Pain is worse with movement. Denies fever, chills, saddle anesthesia, urinary or fecal incontinence, lower extremity weakness.  PCP Dr. Dimas Aguas Neurosurgeon Dr. Jordan Likes Past Medical History  Diagnosis Date  . Back pain   . Headache     occ headaches  . GERD (gastroesophageal reflux disease)     Past Surgical History  Procedure Date  . Knee arthroscopy   . Lumbar laminectomy/decompression microdiscectomy 06/05/2011    Procedure: LUMBAR LAMINECTOMY/DECOMPRESSION MICRODISCECTOMY;  Surgeon: Temple Pacini, MD;  Location: [facility];  Service: Neurosurgery;  Laterality: Right;  Right Lumbar five-Sacral one laminectomy/microdiscectomy  . Back surgery     Family History  Problem Relation Age of Onset  . Diabetes Mother   . Hypertension Mother   . Diabetes Father     History  Substance Use Topics  . Smoking status: Current Every Day Smoker -- 1.0 packs/day  . Smokeless tobacco: Not on file  . Alcohol Use: 0.0 oz/week    0-1 Glasses of wine per week      Review of Systems  Constitutional: Negative for fever.       10 Systems reviewed and are negative for acute change except as noted in the HPI.   HENT: Negative for congestion.   Eyes: Negative for discharge and redness.  Respiratory: Negative for cough and shortness of breath.   Cardiovascular: Negative for chest pain.  Gastrointestinal: Negative for vomiting and abdominal pain.  Musculoskeletal: Positive for back pain.  Skin: Negative for rash.  Neurological: Negative for syncope, numbness and headaches.  Psychiatric/Behavioral:       No behavior change.    Allergies  Ms contin; Penicillins; Tramadol; Erythrocin; and Ketorolac tromethamine  Home Medications   Current Outpatient Rx  Name  Route  Sig  Dispense  Refill  . ACETAMINOPHEN 500 MG PO TABS   Oral   Take 500-1,000 mg by mouth once as needed. For dental pain         . DIAZEPAM 5 MG PO TABS   Oral   Take 1 tablet (5 mg total) by mouth 2 (two) times daily.   5 tablet   0   . NAPROXEN SODIUM 220 MG PO TABS   Oral   Take 440 mg by mouth 2 (two) times daily with a meal. pain         . OXYCODONE-ACETAMINOPHEN 5-325 MG PO TABS   Oral   Take 2 tablets by mouth every 4 (four) hours as needed for pain.   11 tablet   0   . OXYCODONE-ACETAMINOPHEN 5-325 MG PO TABS   Oral   Take 1 tablet by mouth every 6 (six) hours as needed for pain.  10 tablet   0     BP 119/74  Pulse 86  Temp 97.5 F (36.4 C) (Oral)  Resp 22  Ht 6\' 5"  (1.956 m)  Wt 200 lb (90.719 kg)  BMI 23.72 kg/m2  SpO2 100%  Physical Exam  Nursing note and vitals reviewed. Constitutional:       Awake, alert, nontoxic appearance.  HENT:  Head: Atraumatic.  Eyes: Right eye exhibits no discharge. Left eye exhibits no discharge.  Neck: Neck supple.  Cardiovascular: Normal heart sounds.   Pulmonary/Chest: Effort normal and breath sounds normal. He exhibits no tenderness.  Abdominal: Soft. There is no tenderness. There is no rebound.  Musculoskeletal: He exhibits no tenderness.       Baseline ROM, no obvious new focal weakness.No spinal tenderness to percussion. Well healed lumbar scar.  Mild lumbar paraspinal tenderness bilaterally to palpation.  Neurological: He has normal reflexes.       Mental status and motor strength appears baseline for patient and situation.  Skin: No rash noted.  Psychiatric: He has a normal mood and affect.    ED Course  Procedures (including critical care time)     1. Back pain       MDM  Patient with h/o chronic back pain s/p surgery a year ago and with continued pain. Given anaglesic. Encouraged to keep follow up with neurosurgeon. Pt stable in ED with no significant deterioration in condition.The patient appears reasonably screened and/or stabilized for discharge and I doubt any other medical condition or other [facility] requiring further screening, evaluation, or treatment in the ED at this time prior to discharge.  MDM Reviewed: nursing note, vitals and previous chart          Nicoletta Dress. Colon Branch, MD 05/11/12 1009

## 2012-05-04 NOTE — ED Notes (Signed)
Pt complaining and cussing when receiving discharge papers stating "I can get fucking percocet on the damn street, I need a shot of something powerful to help me sleep, this shit don't work". Pt instructed to watch his language and that doctor gave him a prescription to help him until he could see his PCP.

## 2012-05-04 NOTE — ED Notes (Signed)
Discharge instructions reviewed with pt, questions answered. Pt verbalized understanding.  

## 2012-05-04 NOTE — ED Notes (Signed)
Pt reports increased back pain. Had surgery in January. started having pain from back again this month. Was seen in the ER yesterday. Pain only getting worse

## 2012-05-15 ENCOUNTER — Emergency Department (HOSPITAL_COMMUNITY)
Admission: EM | Admit: 2012-05-15 | Discharge: 2012-05-15 | Disposition: A | Discharge status: Home / Self Care | Payer: Self-pay | Attending: Emergency Medicine | Admitting: Emergency Medicine

## 2012-05-15 ENCOUNTER — Encounter (HOSPITAL_COMMUNITY): Payer: Self-pay

## 2012-05-15 DIAGNOSIS — K219 Gastro-esophageal reflux disease without esophagitis: Secondary | ICD-10-CM | POA: Insufficient documentation

## 2012-05-15 DIAGNOSIS — Y929 Unspecified place or not applicable: Secondary | ICD-10-CM | POA: Insufficient documentation

## 2012-05-15 DIAGNOSIS — Z79899 Other long term (current) drug therapy: Secondary | ICD-10-CM | POA: Insufficient documentation

## 2012-05-15 DIAGNOSIS — Y9389 Activity, other specified: Secondary | ICD-10-CM | POA: Insufficient documentation

## 2012-05-15 DIAGNOSIS — F17200 Nicotine dependence, unspecified, uncomplicated: Secondary | ICD-10-CM | POA: Insufficient documentation

## 2012-05-15 DIAGNOSIS — S39012A Strain of muscle, fascia and tendon of lower back, initial encounter: Secondary | ICD-10-CM

## 2012-05-15 DIAGNOSIS — S335XXA Sprain of ligaments of lumbar spine, initial encounter: Secondary | ICD-10-CM | POA: Insufficient documentation

## 2012-05-15 DIAGNOSIS — X58XXXA Exposure to other specified factors, initial encounter: Secondary | ICD-10-CM | POA: Insufficient documentation

## 2012-05-15 MED ORDER — CYCLOBENZAPRINE HCL 10 MG PO TABS: ORAL_TABLET | ORAL | Status: DC

## 2012-05-15 MED ORDER — PREDNISONE 50 MG PO TABS
60.0000 mg | ORAL_TABLET | Freq: Once | ORAL | Status: AC
  Administered 2012-05-15: 60 mg via ORAL
  Filled 2012-05-15: qty 1

## 2012-05-15 MED ORDER — CYCLOBENZAPRINE HCL 10 MG PO TABS
10.0000 mg | ORAL_TABLET | Freq: Once | ORAL | Status: AC
  Administered 2012-05-15: 10 mg via ORAL
  Filled 2012-05-15: qty 1

## 2012-05-15 MED ORDER — PREDNISONE 10 MG PO TABS: ORAL_TABLET | ORAL | Status: DC

## 2012-05-15 NOTE — ED Provider Notes (Signed)
History     CSN: [id number]  Arrival date & time 05/15/12  1219   First MD Initiated Contact with Patient 05/15/12 1334      Chief Complaint  Patient presents with  . 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  H/o narcotics abuse.  Told the pt he would not get any narcotics but prednisone and muscle relaxant.  He agrees,  Patient is a 32 y.o. male presenting with back pain. The history is provided by the patient. No language interpreter was used.  Back Pain  This is a new problem. The current episode started 2 days ago. The problem occurs constantly. The problem has not changed since onset.The pain is present in the lumbar spine. The quality of the pain is described as aching. The pain does not radiate. The pain is moderate. The symptoms are aggravated by bending. Pertinent negatives include no fever. He has tried nothing for the symptoms.    Past Medical History  Diagnosis Date  . Back pain   . Headache     occ headaches  . GERD (gastroesophageal reflux disease)     Past Surgical History  Procedure Date  . Knee arthroscopy   . Lumbar laminectomy/decompression microdiscectomy 06/05/2011    Procedure: LUMBAR LAMINECTOMY/DECOMPRESSION MICRODISCECTOMY;  Surgeon: Temple Pacini, MD;  Location: [facility];  Service: Neurosurgery;  Laterality: Right;  Right Lumbar five-Sacral one laminectomy/microdiscectomy  . Back surgery     Family History  Problem Relation Age of Onset  . Diabetes Mother   . Hypertension Mother   . Diabetes Father     History  Substance Use Topics  . Smoking status: Current Every Day Smoker -- 1.0 packs/day  . Smokeless tobacco: Not on file  . Alcohol Use: 0.0 oz/week    0-1 Glasses of wine per week      Review of Systems  Constitutional: Negative for fever and chills.  Musculoskeletal: Positive for back pain.  All  other systems reviewed and are negative.    Allergies  Ms contin; Penicillins; Tramadol; Erythrocin; and Ketorolac tromethamine  Home Medications   Current Outpatient Rx  Name  Route  Sig  Dispense  Refill  . ACETAMINOPHEN 500 MG PO TABS   Oral   Take 500-1,000 mg by mouth once as needed. For dental pain         . DIAZEPAM 5 MG PO TABS   Oral   Take 1 tablet (5 mg total) by mouth 2 (two) times daily.   5 tablet   0   . NAPROXEN SODIUM 220 MG PO TABS   Oral   Take 440 mg by mouth 2 (two) times daily with a meal. pain         . OXYCODONE-ACETAMINOPHEN 5-325 MG PO TABS   Oral   Take 1 tablet by mouth every 6 (six) hours as needed for pain.   10 tablet   0   . CYCLOBENZAPRINE HCL 10 MG PO TABS      1/2 to one po TID   20 tablet   0   . PREDNISONE 10 MG PO TABS      Taper QD:6,5,4,3,2,1   21 tablet   0     BP 123/86  Pulse 84  Temp 98 F (36.7 C) (Oral)  Resp 18  Ht 6\' 5"  (1.956 m)  Wt 200 lb (90.719 kg)  BMI 23.72 kg/m2  SpO2 98%  Physical Exam  Nursing note and vitals reviewed. Constitutional: He is oriented to person, place, and time. He appears well-developed and well-nourished.  HENT:  Head: Normocephalic and atraumatic.  Eyes: EOM are normal.  Neck: Normal range of motion.  Cardiovascular: Normal rate, regular rhythm, normal heart sounds and intact distal pulses.   Pulmonary/Chest: Effort normal and breath sounds normal. No respiratory distress.  Abdominal: Soft. He exhibits no distension. There is no tenderness.  Musculoskeletal: He exhibits tenderness.       Lumbar back: He exhibits decreased range of motion, tenderness and pain. He exhibits no bony tenderness, no swelling, no edema, no deformity, no laceration, no spasm and normal pulse.       Back:  Neurological: He is alert and oriented to person, place, and time.  Skin: Skin is warm and dry.  Psychiatric: He has a normal mood and affect. Judgment normal.    ED Course  Procedures  (including critical care time)  Labs Reviewed - No data to display No results found.   1. Lumbar strain       MDM  rx-pred taper, 21 rx-flexeril , 20 Ice F/u with your MD prn        Evalina Field, PA 05/15/12 1542

## 2012-05-15 NOTE — ED Notes (Signed)
Pt reports had surgery on lower back last Jan.  Says started having pain again since Nov.

## 2012-05-17 NOTE — ED Provider Notes (Signed)
Medical screening examination/treatment/procedure(s) were performed by non-physician practitioner and as supervising physician I was immediately available for consultation/collaboration.   Shelda Jakes, MD 05/17/12 2043

## 2012-07-13 ENCOUNTER — Emergency Department (HOSPITAL_COMMUNITY)
Admission: EM | Admit: 2012-07-13 | Discharge: 2012-07-13 | Disposition: A | Discharge status: Home / Self Care | Payer: Self-pay | Attending: Emergency Medicine | Admitting: Emergency Medicine

## 2012-07-13 ENCOUNTER — Encounter (HOSPITAL_COMMUNITY): Payer: Self-pay | Admitting: *Deleted

## 2012-07-13 DIAGNOSIS — M79609 Pain in unspecified limb: Secondary | ICD-10-CM | POA: Insufficient documentation

## 2012-07-13 DIAGNOSIS — Z9889 Other specified postprocedural states: Secondary | ICD-10-CM | POA: Insufficient documentation

## 2012-07-13 DIAGNOSIS — Z8719 Personal history of other diseases of the digestive system: Secondary | ICD-10-CM | POA: Insufficient documentation

## 2012-07-13 DIAGNOSIS — M545 Low back pain, unspecified: Secondary | ICD-10-CM | POA: Insufficient documentation

## 2012-07-13 DIAGNOSIS — F17200 Nicotine dependence, unspecified, uncomplicated: Secondary | ICD-10-CM | POA: Insufficient documentation

## 2012-07-13 MED ORDER — HYDROCODONE-ACETAMINOPHEN 5-325 MG PO TABS: ORAL_TABLET | ORAL | Status: DC

## 2012-07-13 MED ORDER — CYCLOBENZAPRINE HCL 10 MG PO TABS
10.0000 mg | ORAL_TABLET | Freq: Once | ORAL | Status: AC
  Administered 2012-07-13: 10 mg via ORAL
  Filled 2012-07-13: qty 1

## 2012-07-13 MED ORDER — HYDROCODONE-ACETAMINOPHEN 5-325 MG PO TABS
2.0000 | ORAL_TABLET | Freq: Once | ORAL | Status: AC
  Administered 2012-07-13: 2 via ORAL
  Filled 2012-07-13: qty 2

## 2012-07-13 MED ORDER — CYCLOBENZAPRINE HCL 10 MG PO TABS: 10.0000 mg | ORAL_TABLET | Freq: Three times a day (TID) | ORAL | Status: DC | PRN

## 2012-07-13 NOTE — ED Notes (Signed)
Pt with lower back pain, had bent over earlier today and was unable to get up, pt states that he had surgery over a year ago for it and pain is in same place, pt has ran out of flexeril, denies incontinence of urine or bowels

## 2012-07-13 NOTE — ED Provider Notes (Signed)
History     CSN: [id number]  Arrival date & time 07/13/12  2006   First MD Initiated Contact with Patient 07/13/12 2031      Chief Complaint  Patient presents with  . Back Pain    (Consider location/radiation/quality/duration/timing/severity/associated sxs/prior treatment) Patient is a 32 y.o. male presenting with back pain. The history is provided by the patient.  Back Pain Location:  Lumbar spine Quality:  Aching and shooting Radiates to:  R posterior upper leg and R thigh Pain severity:  Moderate Onset quality:  Gradual Duration:  3 days Timing:  Constant Progression:  Worsening Chronicity:  Recurrent Context: not falling, not jumping from heights, not lifting heavy objects and not recent injury   Relieved by:  Bed rest Worsened by:  Bending, ambulation, standing, twisting and movement Ineffective treatments:  None tried Associated symptoms: leg pain   Associated symptoms: no abdominal pain, no abdominal swelling, no bladder incontinence, no bowel incontinence, no chest pain, no dysuria, no fever, no headaches, no numbness, no paresthesias, no pelvic pain, no perianal numbness, no tingling and no weakness     Past Medical History  Diagnosis Date  . Back pain   . Headache     occ headaches  . GERD (gastroesophageal reflux disease)     Past Surgical History  Procedure Laterality Date  . Knee arthroscopy    . Lumbar laminectomy/decompression microdiscectomy  06/05/2011    Procedure: LUMBAR LAMINECTOMY/DECOMPRESSION MICRODISCECTOMY;  Surgeon: Temple Pacini, MD;  Location: [facility];  Service: Neurosurgery;  Laterality: Right;  Right Lumbar five-Sacral one laminectomy/microdiscectomy  . Back surgery      Family History  Problem Relation Age of Onset  . Diabetes Mother   . Hypertension Mother   . Diabetes Father     History  Substance Use Topics  . Smoking status: Current Every Day Smoker -- 1.00 packs/day  . Smokeless tobacco: Not on file  . Alcohol Use: 0.0  oz/week    0-1 Glasses of wine per week      Review of Systems  Constitutional: Negative for fever.  Respiratory: Negative for shortness of breath.   Cardiovascular: Negative for chest pain.  Gastrointestinal: Negative for vomiting, abdominal pain, constipation and bowel incontinence.  Genitourinary: Negative for bladder incontinence, dysuria, hematuria, flank pain, decreased urine volume, difficulty urinating and pelvic pain.       No perineal numbness or incontinence of urine or feces  Musculoskeletal: Positive for back pain. Negative for joint swelling.  Skin: Negative for rash.  Neurological: Negative for tingling, weakness, numbness, headaches and paresthesias.  All other systems reviewed and are negative.    Allergies  Ms contin; Penicillins; Tramadol; Erythrocin; and Ketorolac tromethamine  Home Medications   Current Outpatient Rx  Name  Route  Sig  Dispense  Refill  . acetaminophen (TYLENOL) 500 MG tablet   Oral   Take 500-1,000 mg by mouth once as needed. For dental pain           BP 117/72  Pulse 96  Temp(Src) 97.9 F (36.6 C) (Oral)  Resp 18  Ht 6\' 5"  (1.956 m)  Wt 205 lb (92.987 kg)  BMI 24.3 kg/m2  SpO2 99%  Physical Exam  Nursing note and vitals reviewed. Constitutional: He is oriented to person, place, and time. He appears well-developed and well-nourished. No distress.  HENT:  Head: Normocephalic and atraumatic.  Neck: Normal range of motion. Neck supple.  Cardiovascular: Normal rate, regular rhythm and intact distal pulses.   No  murmur heard. Pulmonary/Chest: Effort normal and breath sounds normal.  Musculoskeletal: He exhibits tenderness. He exhibits no edema.       Lumbar back: He exhibits tenderness and pain. He exhibits normal range of motion, no bony tenderness, no swelling, no deformity, no laceration and normal pulse.       Back:  ttp of the right lumbar paraspinal muscles.  Dp pulses are brisk and symmetrical, distal sensation intact.   Pain is reproduced with SLR on right.    Neurological: He is alert and oriented to person, place, and time. No cranial nerve deficit or sensory deficit. He exhibits normal muscle tone. Coordination and gait normal.  Reflex Scores:      Patellar reflexes are 2+ on the right side and 2+ on the left side.      Achilles reflexes are 2+ on the right side and 2+ on the left side. Skin: Skin is warm and dry.    ED Course  Procedures (including critical care time)  Labs Reviewed - No data to display No results found.      MDM    Previous ED charts reviewed.     Patient has ttp of the lumbar paraspinal muscles.  Hx of chronic back pain.  No dysuria sx's, saddle anesthesia's or LE weakness of numbness.  No focal neuro deficits on exam.  Ambulates with a steady gait.   Doubt emergent neurological or infectious process.    Prescribed: norco #12 Flexeril Prednisone taper   Kindra Bickham L. [place], [place] 07/15/12 1826

## 2012-07-13 NOTE — ED Notes (Signed)
Pt c/o lower back pain. States he feels as if he has knives sticking in his back.

## 2012-07-15 NOTE — ED Provider Notes (Signed)
Medical screening examination/treatment/procedure(s) were performed by non-physician practitioner and as supervising physician I was immediately available for consultation/collaboration.  Donnetta Hutching, MD 07/15/12 2350

## 2012-10-10 ENCOUNTER — Emergency Department (HOSPITAL_COMMUNITY)
Admission: EM | Admit: 2012-10-10 | Discharge: 2012-10-10 | Disposition: A | Discharge status: Home / Self Care | Payer: PRIVATE HEALTH INSURANCE | Attending: Emergency Medicine | Admitting: Emergency Medicine

## 2012-10-10 ENCOUNTER — Encounter (HOSPITAL_COMMUNITY): Payer: Self-pay | Admitting: Emergency Medicine

## 2012-10-10 DIAGNOSIS — IMO0002 Reserved for concepts with insufficient information to code with codable children: Secondary | ICD-10-CM

## 2012-10-10 DIAGNOSIS — M519 Unspecified thoracic, thoracolumbar and lumbosacral intervertebral disc disorder: Secondary | ICD-10-CM | POA: Insufficient documentation

## 2012-10-10 DIAGNOSIS — Z79899 Other long term (current) drug therapy: Secondary | ICD-10-CM | POA: Insufficient documentation

## 2012-10-10 DIAGNOSIS — Z8719 Personal history of other diseases of the digestive system: Secondary | ICD-10-CM | POA: Insufficient documentation

## 2012-10-10 DIAGNOSIS — M549 Dorsalgia, unspecified: Secondary | ICD-10-CM

## 2012-10-10 DIAGNOSIS — F17200 Nicotine dependence, unspecified, uncomplicated: Secondary | ICD-10-CM | POA: Insufficient documentation

## 2012-10-10 DIAGNOSIS — Z880 Allergy status to penicillin: Secondary | ICD-10-CM | POA: Insufficient documentation

## 2012-10-10 DIAGNOSIS — M545 Low back pain, unspecified: Secondary | ICD-10-CM | POA: Insufficient documentation

## 2012-10-10 MED ORDER — PREDNISONE 20 MG PO TABS: 20.0000 mg | ORAL_TABLET | Freq: Two times a day (BID) | ORAL | Status: DC

## 2012-10-10 MED ORDER — OXYCODONE-ACETAMINOPHEN 5-325 MG PO TABS: 1.0000 | ORAL_TABLET | ORAL | Status: DC | PRN

## 2012-10-10 NOTE — ED Provider Notes (Signed)
History     CSN: [id number]  Arrival date & time 10/10/12  0022   First MD Initiated Contact with Patient 10/10/12 [phone]      Chief Complaint  Patient presents with  . Back Pain    (Consider location/radiation/quality/duration/timing/severity/associated sxs/prior treatment) HPI Comments: Ryan Valentine is a 32 y.o. male who is here for evaluation of right low back pain. The pain started 2 days ago. There's been no specific trauma. It is similar to pain that he previously. Laminectomy in 2013, with some improvement of his pain. Pain radiates to his right leg. He denies bowel or urinary incontinence. The pain is worse with movement. He was unable to complete his usual shift, yesterday. He would like a referral to a new doctor for back pain. He denies recent fever, chills, nausea, vomiting, weakness, or dizziness. There are no other modifying factors.  Patient is a 32 y.o. male presenting with back pain. The history is provided by the patient.  Back Pain   Past Medical History  Diagnosis Date  . Back pain   . Headache(784.0)     occ headaches  . GERD (gastroesophageal reflux disease)     Past Surgical History  Procedure Laterality Date  . Knee arthroscopy    . Lumbar laminectomy/decompression microdiscectomy  06/05/2011    Procedure: LUMBAR LAMINECTOMY/DECOMPRESSION MICRODISCECTOMY;  Surgeon: Temple Pacini, MD;  Location: [facility];  Service: Neurosurgery;  Laterality: Right;  Right Lumbar five-Sacral one laminectomy/microdiscectomy  . Back surgery      Family History  Problem Relation Age of Onset  . Diabetes Mother   . Hypertension Mother   . Diabetes Father     History  Substance Use Topics  . Smoking status: Current Every Day Smoker -- 1.00 packs/day  . Smokeless tobacco: Not on file  . Alcohol Use: 0.0 oz/week    0-1 Glasses of wine per week      Review of Systems  Musculoskeletal: Positive for back pain.  All other systems reviewed and are  negative.    Allergies  Ms contin; Penicillins; Tramadol; Erythrocin; and Ketorolac tromethamine  Home Medications   Current Outpatient Rx  Name  Route  Sig  Dispense  Refill  . acetaminophen (TYLENOL) 500 MG tablet   Oral   Take 500-1,000 mg by mouth once as needed. For dental pain         . cyclobenzaprine (FLEXERIL) 10 MG tablet   Oral   Take 1 tablet (10 mg total) by mouth 3 (three) times daily as needed for muscle spasms.   21 tablet   0   . HYDROcodone-acetaminophen (NORCO/VICODIN) 5-325 MG per tablet      Take one-two tabs po q 4-6 hrs prn pain   15 tablet   0   . oxyCODONE-acetaminophen (PERCOCET/ROXICET) 5-325 MG per tablet   Oral   Take 1 tablet by mouth every 4 (four) hours as needed for pain.   15 tablet   0   . predniSONE (DELTASONE) 20 MG tablet   Oral   Take 1 tablet (20 mg total) by mouth 2 (two) times daily.   10 tablet   0     BP 134/81  Pulse 75  Temp(Src) 97 F (36.1 C) (Oral)  Resp 20  Ht 6\' 5"  (1.956 m)  Wt 190 lb (86.183 kg)  BMI 22.53 kg/m2  SpO2 98%  Physical Exam  Nursing note and vitals reviewed. Constitutional: He is oriented to person, place, and time. He appears  well-developed and well-nourished.  HENT:  Head: Normocephalic and atraumatic.  Right Ear: External ear normal.  Left Ear: External ear normal.  Eyes: Conjunctivae and EOM are normal. Pupils are equal, round, and reactive to light.  Neck: Normal range of motion and phonation normal. Neck supple.  Cardiovascular: Normal rate, regular rhythm, normal heart sounds and intact distal pulses.   Pulmonary/Chest: Effort normal and breath sounds normal. He exhibits no bony tenderness.  Abdominal: Soft. Normal appearance. There is no tenderness.  Musculoskeletal:  Mild right lower back tenderness; with normal range of motion  Neurological: He is alert and oriented to person, place, and time. He has normal strength. No cranial nerve deficit or sensory deficit. He exhibits  normal muscle tone. Coordination normal.  Skin: Skin is warm, dry and intact.  Psychiatric: He has a normal mood and affect. His behavior is normal. Judgment and thought content normal.    ED Course  Procedures (including critical care time)      1. Back pain   2. Degenerative disc disease       MDM  Recurrent low back pain with degenerative joint disease; and status post laminectomy last year. Doubt cauda equina or discitis. Symptoms are similar to pain that he had when he had a lower lumbar discectomy. It is possible that he has recurrence of this disc abnormality. He is stable for discharge.  Nursing Notes Reviewed/ Care Coordinated, and agree without changes. Applicable Imaging Reviewed.  Interpretation of Laboratory Data incorporated into ED treatment   Plan: Home Medications- prednisone, Percocet; Home Treatments- rest; Recommended follow up- neurosurgery ASAP        Flint Melter, MD 10/10/12 [phone]

## 2012-10-10 NOTE — ED Notes (Signed)
Requesting pain medication - states his wife dropped him off and he will need to call her for transport home if given pain medication.  States his pain is severe and he feels like crying due to the pain

## 2012-10-10 NOTE — ED Notes (Signed)
Patient c/o lower back pain that radiates down his right leg.  Patient states has had surgery on his back last January.

## 2012-11-01 ENCOUNTER — Encounter (HOSPITAL_COMMUNITY): Payer: Self-pay | Admitting: *Deleted

## 2012-11-01 ENCOUNTER — Emergency Department (HOSPITAL_COMMUNITY)
Admission: EM | Admit: 2012-11-01 | Discharge: 2012-11-01 | Disposition: A | Discharge status: Home / Self Care | Payer: PRIVATE HEALTH INSURANCE | Attending: Emergency Medicine | Admitting: Emergency Medicine

## 2012-11-01 DIAGNOSIS — Z8739 Personal history of other diseases of the musculoskeletal system and connective tissue: Secondary | ICD-10-CM | POA: Insufficient documentation

## 2012-11-01 DIAGNOSIS — Z8719 Personal history of other diseases of the digestive system: Secondary | ICD-10-CM | POA: Insufficient documentation

## 2012-11-01 DIAGNOSIS — Z9889 Other specified postprocedural states: Secondary | ICD-10-CM | POA: Insufficient documentation

## 2012-11-01 DIAGNOSIS — G8929 Other chronic pain: Secondary | ICD-10-CM | POA: Insufficient documentation

## 2012-11-01 DIAGNOSIS — M5416 Radiculopathy, lumbar region: Secondary | ICD-10-CM

## 2012-11-01 DIAGNOSIS — IMO0002 Reserved for concepts with insufficient information to code with codable children: Secondary | ICD-10-CM | POA: Insufficient documentation

## 2012-11-01 DIAGNOSIS — F17200 Nicotine dependence, unspecified, uncomplicated: Secondary | ICD-10-CM | POA: Insufficient documentation

## 2012-11-01 MED ORDER — OXYCODONE-ACETAMINOPHEN 5-325 MG PO TABS: 1.0000 | ORAL_TABLET | ORAL | Status: DC | PRN

## 2012-11-01 NOTE — ED Notes (Signed)
Lower back pain with pain and numbness radiating down right leg . Appt to see neurosurgeon July 14. Pt states he needs pain relief.

## 2012-11-01 NOTE — ED Notes (Signed)
Low back pain with radiation down rt leg for 3 mos, Has appt with neurosurgeon. Here because of pain.

## 2012-11-01 NOTE — ED Notes (Signed)
Pain rt side of face, with swelling,  Has dental caries.

## 2012-11-03 NOTE — ED Provider Notes (Signed)
History     CSN: [id number]  Arrival date & time 11/01/12  1256   First MD Initiated Contact with Patient 11/01/12 1356      Chief Complaint  Patient presents with  . Back Pain    (Consider location/radiation/quality/duration/timing/severity/associated sxs/prior treatment) HPI Comments: Ryan Valentine is a 32 y.o. Male presenting with acute on chronic low back pain which has which has been worsened over the past few weeks   Patient denies any new injury specifically, he does have a history of ddd and had a lumbar laminectomy by Dr. Dutch Quint 6 months ago,  But has developed return of radiating pain into his right leg just 2 months after his surgery.  He is scheduled to see him for MRI's and probable scheduling for additional surgery on 7/14,  But is requesting pain control until he can make this appointment.  There is radiation into the right lower extremity.  There has been no weakness but does describe numbness to his right calf and no urinary or bowel retention or incontinence.  Patient does not have a history of cancer or IVDU.      The history is provided by the patient.    Past Medical History  Diagnosis Date  . Back pain   . Headache(784.0)     occ headaches  . GERD (gastroesophageal reflux disease)     Past Surgical History  Procedure Laterality Date  . Knee arthroscopy    . Lumbar laminectomy/decompression microdiscectomy  06/05/2011    Procedure: LUMBAR LAMINECTOMY/DECOMPRESSION MICRODISCECTOMY;  Surgeon: Temple Pacini, MD;  Location: [facility];  Service: Neurosurgery;  Laterality: Right;  Right Lumbar five-Sacral one laminectomy/microdiscectomy  . Back surgery      Family History  Problem Relation Age of Onset  . Diabetes Mother   . Hypertension Mother   . Diabetes Father     History  Substance Use Topics  . Smoking status: Current Every Day Smoker -- 1.00 packs/day  . Smokeless tobacco: Not on file  . Alcohol Use: 0.0 oz/week    0-1 Glasses of wine per  week      Review of Systems  Constitutional: Negative for fever.  Respiratory: Negative for shortness of breath.   Cardiovascular: Negative for chest pain and leg swelling.  Gastrointestinal: Negative for abdominal pain, constipation and abdominal distention.  Genitourinary: Negative for dysuria, urgency, frequency, flank pain and difficulty urinating.  Musculoskeletal: Positive for back pain. Negative for joint swelling and gait problem.  Skin: Negative for rash.  Neurological: Negative for weakness and numbness.    Allergies  Ms contin; Penicillins; Tramadol; Erythrocin; and Ketorolac tromethamine  Home Medications   Current Outpatient Rx  Name  Route  Sig  Dispense  Refill  . ibuprofen (ADVIL,MOTRIN) 200 MG tablet   Oral   Take 400 mg by mouth every 6 (six) hours as needed for pain.         Marland Kitchen oxyCODONE-acetaminophen (PERCOCET/ROXICET) 5-325 MG per tablet   Oral   Take 1 tablet by mouth every 4 (four) hours as needed for pain.   30 tablet   0     BP 143/83  Pulse 64  Temp(Src) 97.8 F (36.6 C) (Oral)  Resp 16  SpO2 100%  Physical Exam  Nursing note and vitals reviewed. Constitutional: He appears well-developed and well-nourished.  HENT:  Head: Normocephalic.  Eyes: Conjunctivae are normal.  Neck: Normal range of motion. Neck supple.  Cardiovascular: Normal rate and intact distal pulses.   Pedal pulses  normal.  Pulmonary/Chest: Effort normal.  Abdominal: Soft. Bowel sounds are normal. He exhibits no distension and no mass.  Musculoskeletal: Normal range of motion. He exhibits no edema.       Lumbar back: He exhibits tenderness. He exhibits no swelling, no edema and no spasm.  Paralumbar ttp right.  No midline pain.  Neurological: He is alert. He has normal strength. He displays no atrophy and no tremor. No sensory deficit. Gait normal.  Reflex Scores:      Patellar reflexes are 2+ on the right side and 2+ on the left side.      Achilles reflexes are 2+ on  the right side and 2+ on the left side. No strength deficit noted in hip and knee flexor and extensor muscle groups.  Ankle flexion and extension intact.  Skin: Skin is warm and dry.  Psychiatric: He has a normal mood and affect.    ED Course  Procedures (including critical care time)  Labs Reviewed - No data to display No results found.   1. Lumbar radiculopathy, chronic       MDM  No neuro deficit on exam or by history to suggest emergent or surgical presentation.  Also discussed worsened sx that should prompt immediate re-evaluation including distal weakness, bowel/bladder retention/incontinence.  Pt was prescribed oxycodone.  Encouraged to keep appt with Dr Dutch Quint as planned.              Burgess Amor, PA-C 11/03/12 [phone]

## 2012-11-05 NOTE — ED Provider Notes (Signed)
Medical screening examination/treatment/procedure(s) were performed by non-physician practitioner and as supervising physician I was immediately available for consultation/collaboration.   Zakaree Mcclenahan W. Keliah Harned, MD 11/05/12 1552 

## 2012-12-09 DIAGNOSIS — R079 Chest pain, unspecified: Secondary | ICD-10-CM

## 2012-12-10 DIAGNOSIS — R079 Chest pain, unspecified: Secondary | ICD-10-CM

## 2013-01-10 ENCOUNTER — Emergency Department (HOSPITAL_COMMUNITY)
Admission: EM | Admit: 2013-01-10 | Discharge: 2013-01-10 | Disposition: A | Discharge status: Home / Self Care | Payer: Self-pay | Attending: Emergency Medicine | Admitting: Emergency Medicine

## 2013-01-10 ENCOUNTER — Encounter (HOSPITAL_COMMUNITY): Payer: Self-pay | Admitting: *Deleted

## 2013-01-10 ENCOUNTER — Emergency Department (HOSPITAL_COMMUNITY)
Admission: EM | Admit: 2013-01-10 | Discharge: 2013-01-10 | Disposition: A | Discharge status: Home / Self Care | Payer: No Typology Code available for payment source | Attending: Emergency Medicine | Admitting: Emergency Medicine

## 2013-01-10 ENCOUNTER — Emergency Department (HOSPITAL_COMMUNITY): Payer: No Typology Code available for payment source

## 2013-01-10 DIAGNOSIS — Z880 Allergy status to penicillin: Secondary | ICD-10-CM | POA: Insufficient documentation

## 2013-01-10 DIAGNOSIS — R0789 Other chest pain: Secondary | ICD-10-CM

## 2013-01-10 DIAGNOSIS — F1110 Opioid abuse, uncomplicated: Secondary | ICD-10-CM | POA: Insufficient documentation

## 2013-01-10 DIAGNOSIS — S298XXA Other specified injuries of thorax, initial encounter: Secondary | ICD-10-CM | POA: Insufficient documentation

## 2013-01-10 DIAGNOSIS — F1310 Sedative, hypnotic or anxiolytic abuse, uncomplicated: Secondary | ICD-10-CM | POA: Insufficient documentation

## 2013-01-10 DIAGNOSIS — Y92410 Unspecified street and highway as the place of occurrence of the external cause: Secondary | ICD-10-CM | POA: Insufficient documentation

## 2013-01-10 DIAGNOSIS — F329 Major depressive disorder, single episode, unspecified: Secondary | ICD-10-CM | POA: Insufficient documentation

## 2013-01-10 DIAGNOSIS — Z8669 Personal history of other diseases of the nervous system and sense organs: Secondary | ICD-10-CM | POA: Insufficient documentation

## 2013-01-10 DIAGNOSIS — F3289 Other specified depressive episodes: Secondary | ICD-10-CM | POA: Insufficient documentation

## 2013-01-10 DIAGNOSIS — Z8719 Personal history of other diseases of the digestive system: Secondary | ICD-10-CM | POA: Insufficient documentation

## 2013-01-10 DIAGNOSIS — F17200 Nicotine dependence, unspecified, uncomplicated: Secondary | ICD-10-CM | POA: Insufficient documentation

## 2013-01-10 DIAGNOSIS — Y9389 Activity, other specified: Secondary | ICD-10-CM | POA: Insufficient documentation

## 2013-01-10 DIAGNOSIS — R0602 Shortness of breath: Secondary | ICD-10-CM | POA: Insufficient documentation

## 2013-01-10 DIAGNOSIS — F1190 Opioid use, unspecified, uncomplicated: Secondary | ICD-10-CM

## 2013-01-10 LAB — CBC WITH DIFFERENTIAL/PLATELET
HCT: 40.4 % (ref 39.0–52.0)
Hemoglobin: 13.9 g/dL (ref 13.0–17.0)
Lymphocytes Relative: 17 % (ref 12–46)
Lymphs Abs: 1.9 10*3/uL (ref 0.7–4.0)
Monocytes Absolute: 0.6 10*3/uL (ref 0.1–1.0)
Monocytes Relative: 6 % (ref 3–12)
Neutro Abs: 8.2 10*3/uL — ABNORMAL HIGH (ref 1.7–7.7)
Neutrophils Relative %: 76 % (ref 43–77)
RBC: 4.23 MIL/uL (ref 4.22–5.81)
WBC: 10.8 10*3/uL — ABNORMAL HIGH (ref 4.0–10.5)

## 2013-01-10 LAB — BASIC METABOLIC PANEL
BUN: 12 mg/dL (ref 6–23)
Chloride: 103 mEq/L (ref 96–112)
GFR calc Af Amer: >90 mL/min (ref >90)
Potassium: 3.7 mEq/L (ref 3.5–5.1)
Sodium: 140 mEq/L (ref 135–145)

## 2013-01-10 LAB — RAPID URINE DRUG SCREEN, HOSP PERFORMED
Barbiturates: NOT DETECTED
Benzodiazepines: POSITIVE — AB
Cocaine: NOT DETECTED
Opiates: POSITIVE — AB
Tetrahydrocannabinol: NOT DETECTED
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
## 2013-01-10 NOTE — ED Notes (Signed)
Wife called to check on pt, [phone]
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
## 2013-01-10 NOTE — ED Notes (Signed)
Telepsych in progress. 

## 2013-01-10 NOTE — ED Notes (Signed)
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 They stated they found a text message in patients phone and pt will become upset with family if he finds out. They are worried that he may have overdosed.

## 2013-01-10 NOTE — ED Notes (Signed)
Brought in for psych evaluation, IVC papers in place, accompanied by [employer]

## 2013-01-10 NOTE — ED Notes (Signed)
Pt  Signed do not harm paperwork and secretary resented IVC paperwork, Officer will take pt home. Tried to call wife with the number she left, phone not in service. Told officer to tell family we tried to call.

## 2013-01-10 NOTE — ED Notes (Signed)
Pt hungry, tech heated a meal, pt eating, paper work signed, Technical sales engineer will take pt home.

## 2013-01-10 NOTE — ED Notes (Signed)
Pt sleeping, officer at the bedside.

## 2013-01-10 NOTE — ED Notes (Signed)
Pt left without receiving d/c papers or signing.  

## 2013-01-10 NOTE — ED Notes (Signed)
Spoke with Tom at [facility] is scheduled at 2100

## 2013-01-10 NOTE — ED Notes (Signed)
Officers at the bedside. Pt sleeping , pt awake to name then falls right back to sleep.

## 2013-01-13 ENCOUNTER — Emergency Department (HOSPITAL_COMMUNITY)
Admission: EM | Admit: 2013-01-13 | Discharge: 2013-01-13 | Disposition: A | Discharge status: Home / Self Care | Payer: Self-pay | Attending: Emergency Medicine | Admitting: Emergency Medicine

## 2013-01-13 ENCOUNTER — Encounter (HOSPITAL_COMMUNITY): Payer: Self-pay | Admitting: *Deleted

## 2013-01-13 DIAGNOSIS — K219 Gastro-esophageal reflux disease without esophagitis: Secondary | ICD-10-CM | POA: Insufficient documentation

## 2013-01-13 DIAGNOSIS — F17200 Nicotine dependence, unspecified, uncomplicated: Secondary | ICD-10-CM | POA: Insufficient documentation

## 2013-01-13 DIAGNOSIS — F19939 Other psychoactive substance use, unspecified with withdrawal, unspecified: Secondary | ICD-10-CM | POA: Insufficient documentation

## 2013-01-13 DIAGNOSIS — F1123 Opioid dependence with withdrawal: Secondary | ICD-10-CM

## 2013-01-13 DIAGNOSIS — Z880 Allergy status to penicillin: Secondary | ICD-10-CM | POA: Insufficient documentation

## 2013-01-13 DIAGNOSIS — F1120 Opioid dependence, uncomplicated: Secondary | ICD-10-CM | POA: Insufficient documentation

## 2013-01-13 MED ORDER — CLONIDINE HCL 0.1 MG PO TABS: 0.1000 mg | ORAL_TABLET | Freq: Two times a day (BID) | ORAL | Status: DC

## 2013-01-13 NOTE — ED Notes (Signed)
Patient wants help with detox from opiods.   Vomiting , diarrhea.  Last used narcotics on Friday.

## 2013-01-13 NOTE — ED Notes (Signed)
Pt alert & oriented x4, stable gait. Patient given discharge instructions, paperwork & prescription(s). Patient  instructed to stop at the registration desk to finish any additional paperwork. Patient verbalized understanding. Pt left department w/ no further questions. 

## 2013-01-13 NOTE — ED Provider Notes (Signed)
CSN: [id number]     Arrival date & time 01/13/13  1445 History  This chart was scribed for Joya Gaskins, MD by Bennett Scrape, ED Scribe. This patient was seen in room [facility]8/[facility] and the patient's care was started at 3:38 PM.  Chief Complaint - substance abuse  The history is provided by the patient. No language interpreter was used.    HPI Comments: Ryan Valentine is a 32 y.o. male who presents to the Emergency Department requesting detox from opioids. He states that he "took everything I could get my hands on". He denies IV drug use.  He reports that his last use of narcotics 3 days ago and he has trying the "cold [place]" method since. He reports associated emesis, myalgias and diarrhea since then. He admits having Zofran at home that has improved the emesis.  He is here for evaluation, because the symptoms have been the worst today. He denies SI or HI currently. He denies any other drug use. He also denies fevers, acute CP or abdominal pain and seizures as associated symptoms.   Past Medical History  Diagnosis Date  . Back pain   . Headache(784.0)     occ headaches  . GERD (gastroesophageal reflux disease)    Past Surgical History  Procedure Laterality Date  . Knee arthroscopy    . Lumbar laminectomy/decompression microdiscectomy  06/05/2011    Procedure: LUMBAR LAMINECTOMY/DECOMPRESSION MICRODISCECTOMY;  Surgeon: Temple Pacini, MD;  Location: [facility];  Service: Neurosurgery;  Laterality: Right;  Right Lumbar five-Sacral one laminectomy/microdiscectomy  . Back surgery     Family History  Problem Relation Age of Onset  . Diabetes Mother   . Hypertension Mother   . Diabetes Father    History  Substance Use Topics  . Smoking status: Current Every Day Smoker -- 1.00 packs/day  . Smokeless tobacco: Not on file  . Alcohol Use: 0.0 oz/week    0-1 Glasses of wine per week    Review of Systems  A complete 10 system review of systems was obtained and all systems are  negative except as noted in the HPI and PMH.   Allergies  Ms contin; Penicillins; Tramadol; Erythrocin; and Ketorolac tromethamine  Home Medications   Current Outpatient Rx  Name  Route  Sig  Dispense  Refill  . ibuprofen (ADVIL,MOTRIN) 200 MG tablet   Oral   Take 400 mg by mouth every 6 (six) hours as needed for pain.          Triage Vitals: BP 129/80  Pulse 99  Temp(Src) 98.7 F (37.1 C) (Oral)  Resp 18  Ht 6\' 9"  (2.057 m)  Wt 190 lb (86.183 kg)  BMI 20.37 kg/m2  SpO2 99%  Physical Exam  Nursing note and vitals reviewed.  CONSTITUTIONAL: Well developed/well nourished HEAD: Normocephalic/atraumatic EYES: EOMI/PERRL ENMT: Mucous membranes moist NECK: supple no meningeal signs CV: S1/S2 noted, no murmurs/rubs/gallops noted LUNGS: Lungs are clear to auscultation bilaterally, no apparent distress ABDOMEN: soft, nontender, no rebound or guarding GU:no cva tenderness NEURO: Pt is awake/alert, moves all extremitiesx4 EXTREMITIES: pulses normal, full ROM, no abscesses/wounds to either forearm SKIN: warm, color normal PSYCH: no abnormalities of mood noted  ED Course  Procedures   DIAGNOSTIC STUDIES: Oxygen Saturation is 99% on room air, normal by my interpretation.    COORDINATION OF CARE: 3:41 PM-Advised pt that his symptoms likely nearly  over. Will provide resources for follow up. Discussed discharge plan which includes medications with pt at bedside and  pt agreed to plan.  Short course of clonidine ordered He reports he has zofran at home  Labs Review Labs Reviewed  URINALYSIS, ROUTINE W REFLEX MICROSCOPIC    MDM  No diagnosis found. Nursing notes including past medical history and social history reviewed and considered in documentation   I personally performed the services described in this documentation, which was scribed in my presence. The recorded information has been reviewed and is accurate.      Joya Gaskins, MD 01/13/13 1700

## 2013-01-28 ENCOUNTER — Emergency Department (HOSPITAL_COMMUNITY)
Admission: EM | Admit: 2013-01-28 | Discharge: 2013-01-28 | Disposition: A | Discharge status: Home / Self Care | Payer: Self-pay | Attending: Emergency Medicine | Admitting: Emergency Medicine

## 2013-01-28 ENCOUNTER — Encounter (HOSPITAL_COMMUNITY): Payer: Self-pay

## 2013-01-28 DIAGNOSIS — F19939 Other psychoactive substance use, unspecified with withdrawal, unspecified: Secondary | ICD-10-CM | POA: Insufficient documentation

## 2013-01-28 DIAGNOSIS — Z8719 Personal history of other diseases of the digestive system: Secondary | ICD-10-CM | POA: Insufficient documentation

## 2013-01-28 DIAGNOSIS — R1110 Vomiting, unspecified: Secondary | ICD-10-CM | POA: Insufficient documentation

## 2013-01-28 DIAGNOSIS — Z880 Allergy status to penicillin: Secondary | ICD-10-CM | POA: Insufficient documentation

## 2013-01-28 DIAGNOSIS — F17200 Nicotine dependence, unspecified, uncomplicated: Secondary | ICD-10-CM | POA: Insufficient documentation

## 2013-01-28 HISTORY — DX: Other psychoactive substance abuse, uncomplicated: F19.10

## 2013-01-28 MED ORDER — ONDANSETRON 4 MG PO TBDP: ORAL_TABLET | ORAL | Status: DC

## 2013-01-28 MED ORDER — ONDANSETRON HCL 4 MG/2ML IJ SOLN
4.0000 mg | Freq: Once | INTRAMUSCULAR | Status: AC
  Administered 2013-01-28: 4 mg via INTRAVENOUS
  Filled 2013-01-28: qty 2

## 2013-01-28 MED ORDER — CLONIDINE HCL 0.1 MG PO TABS
0.1000 mg | ORAL_TABLET | Freq: Once | ORAL | Status: AC
  Administered 2013-01-28: 0.1 mg via ORAL
  Filled 2013-01-28: qty 1

## 2013-01-28 MED ORDER — SODIUM CHLORIDE 0.9 % IV BOLUS (SEPSIS)
1000.0000 mL | Freq: Once | INTRAVENOUS | Status: AC
  Administered 2013-01-28: 1000 mL via INTRAVENOUS

## 2013-01-28 MED ORDER — CLONIDINE HCL 0.2 MG PO TABS: 0.1000 mg | ORAL_TABLET | Freq: Two times a day (BID) | ORAL | Status: DC

## 2013-01-28 NOTE — ED Provider Notes (Signed)
CSN: [id number]     Arrival date & time 01/28/13  1228 History   First MD Initiated Contact with Patient 01/28/13 1441     Chief Complaint  Patient presents with  . pain pill withdrawal    (Consider location/radiation/quality/duration/timing/severity/associated sxs/prior Treatment) Patient is a 32 y.o. male presenting with vomiting. The history is provided by the patient (the pt has been having substance abuse withdrawl with vomiting and shakes).  Emesis Severity:  Moderate Timing:  Intermittent Quality:  Undigested food Able to tolerate:  Liquids Progression:  Unchanged Associated symptoms: no abdominal pain, no diarrhea and no headaches     Past Medical History  Diagnosis Date  . Back pain   . Headache(784.0)     occ headaches  . GERD (gastroesophageal reflux disease)   . Substance abuse    Past Surgical History  Procedure Laterality Date  . Knee arthroscopy    . Lumbar laminectomy/decompression microdiscectomy  06/05/2011    Procedure: LUMBAR LAMINECTOMY/DECOMPRESSION MICRODISCECTOMY;  Surgeon: Temple Pacini, MD;  Location: [facility];  Service: Neurosurgery;  Laterality: Right;  Right Lumbar five-Sacral one laminectomy/microdiscectomy  . Back surgery     Family History  Problem Relation Age of Onset  . Diabetes Mother   . Hypertension Mother   . Diabetes Father    History  Substance Use Topics  . Smoking status: Current Every Day Smoker -- 1.00 packs/day  . Smokeless tobacco: Not on file  . Alcohol Use: No    Review of Systems  Constitutional: Negative for appetite change and fatigue.  HENT: Negative for congestion, sinus pressure and ear discharge.   Eyes: Negative for discharge.  Respiratory: Negative for cough.   Cardiovascular: Negative for chest pain.  Gastrointestinal: Positive for vomiting. Negative for abdominal pain and diarrhea.  Genitourinary: Negative for frequency and hematuria.  Musculoskeletal: Negative for back pain.  Skin: Negative for rash.   Neurological: Negative for seizures and headaches.  Psychiatric/Behavioral: Negative for hallucinations.    Allergies  Ms contin; Penicillins; Tramadol; Erythrocin; and Ketorolac tromethamine  Home Medications   Current Outpatient Rx  Name  Route  Sig  Dispense  Refill  . cloNIDine (CATAPRES) 0.2 MG tablet   Oral   Take 0.5 tablets (0.1 mg total) by mouth 2 (two) times daily.   10 tablet   1   . ondansetron (ZOFRAN ODT) 4 MG disintegrating tablet      4mg  ODT q6 hours prn nausea/vomit   12 tablet   0    BP 122/72  Pulse 60  Temp(Src) 98.7 F (37.1 C) (Oral)  Resp 18  Ht 6\' 5"  (1.956 m)  Wt 190 lb (86.183 kg)  BMI 22.53 kg/m2  SpO2 100% Physical Exam  Constitutional: He is oriented to person, place, and time. He appears well-developed.  HENT:  Head: Normocephalic.  Eyes: Conjunctivae and EOM are normal. No scleral icterus.  Neck: Neck supple. No thyromegaly present.  Cardiovascular: Normal rate and regular rhythm.  Exam reveals no gallop and no friction rub.   No murmur heard. Pulmonary/Chest: No stridor. He has no wheezes. He has no rales. He exhibits no tenderness.  Abdominal: He exhibits no distension. There is no tenderness. There is no rebound.  Musculoskeletal: Normal range of motion. He exhibits no edema.  Lymphadenopathy:    He has no cervical adenopathy.  Neurological: He is oriented to person, place, and time. Coordination normal.  Skin: No rash noted. No erythema.  Psychiatric: He has a normal mood and affect. His  behavior is normal.    ED Course  Procedures (including critical care time) Labs Review Labs Reviewed - No data to display Imaging Review No results found.  MDM   1. Substance abuse withdrawal        [name], MD 01/28/13 [id number]

## 2013-01-28 NOTE — ED Notes (Signed)
Pt reports is staying at the [facility] house and has not had narcotic pain medication since Thursday night.  Pt c/o generalized body aches, vomiting this am, shakey, not sleeping.

## 2013-03-01 ENCOUNTER — Emergency Department (HOSPITAL_COMMUNITY)
Admission: EM | Admit: 2013-03-01 | Discharge: 2013-03-01 | Disposition: A | Discharge status: Home / Self Care | Payer: Self-pay | Attending: Emergency Medicine | Admitting: Emergency Medicine

## 2013-03-01 ENCOUNTER — Encounter (HOSPITAL_COMMUNITY): Payer: Self-pay | Admitting: Emergency Medicine

## 2013-03-01 DIAGNOSIS — Z0489 Encounter for examination and observation for other specified reasons: Secondary | ICD-10-CM | POA: Insufficient documentation

## 2013-03-01 DIAGNOSIS — Z880 Allergy status to penicillin: Secondary | ICD-10-CM | POA: Insufficient documentation

## 2013-03-01 DIAGNOSIS — Z8719 Personal history of other diseases of the digestive system: Secondary | ICD-10-CM | POA: Insufficient documentation

## 2013-03-01 DIAGNOSIS — F17200 Nicotine dependence, unspecified, uncomplicated: Secondary | ICD-10-CM | POA: Insufficient documentation

## 2013-03-01 DIAGNOSIS — Z0283 Encounter for blood-alcohol and blood-drug test: Secondary | ICD-10-CM

## 2013-03-01 LAB — RAPID URINE DRUG SCREEN, HOSP PERFORMED
Amphetamines: NOT DETECTED
Barbiturates: NOT DETECTED

## 2013-03-01 NOTE — ED Notes (Signed)
Pt presents from [facility] asking for a UDS,  Pt reports took a drug test this evening at said haouse, however failed test. Was told by staff there to go to ED for a confirmation of results. Pt will be unable to continue staying at this facility if test is indeed positive. Pt reports " have not taken anything in 45 days, except OTC cough medication and Alieve.  Pt does not appear to be " high" at this time.

## 2013-03-01 NOTE — ED Notes (Signed)
A. [employer] Nurse tech escorted said pt to restroom and witnessed urine collection.

## 2013-03-01 NOTE — ED Notes (Signed)
I was staying at a half-way house and tested positive for benzos per pt. I came up to be re screened per pt.

## 2013-03-04 NOTE — ED Provider Notes (Signed)
CSN: [id number]     Arrival date & time 03/01/13  2013 History   First MD Initiated Contact with Patient 03/01/13 2034     Chief Complaint  Patient presents with  . Drug Screen    (Consider location/radiation/quality/duration/timing/severity/associated sxs/prior Treatment) HPI Comments: Ryan Valentine is a 32 y.o. Male presenting for request for a urine drug screen.  He is currently staying at a local half way house and had a urine test today which tested positive for benzodiazepines.  He denies using any medicines other than otc cold products and requests to get rechecked so he can continue his stay at his facility.  He is otherwise without complaint.     The history is provided by the patient.    Past Medical History  Diagnosis Date  . Back pain   . Headache(784.0)     occ headaches  . GERD (gastroesophageal reflux disease)   . Substance abuse    Past Surgical History  Procedure Laterality Date  . Knee arthroscopy    . Lumbar laminectomy/decompression microdiscectomy  06/05/2011    Procedure: LUMBAR LAMINECTOMY/DECOMPRESSION MICRODISCECTOMY;  Surgeon: Temple Pacini, MD;  Location: [facility];  Service: Neurosurgery;  Laterality: Right;  Right Lumbar five-Sacral one laminectomy/microdiscectomy  . Back surgery     Family History  Problem Relation Age of Onset  . Diabetes Mother   . Hypertension Mother   . Diabetes Father    History  Substance Use Topics  . Smoking status: Current Every Day Smoker -- 1.00 packs/day  . Smokeless tobacco: Not on file  . Alcohol Use: No    Review of Systems  Constitutional: Negative for fever.  HENT: Negative for congestion and sore throat.   Eyes: Negative.   Respiratory: Negative for chest tightness and shortness of breath.   Cardiovascular: Negative for chest pain.  Gastrointestinal: Negative for nausea and abdominal pain.  Genitourinary: Negative.   Musculoskeletal: Negative for arthralgias, joint swelling and neck pain.   Skin: Negative.  Negative for rash and wound.  Neurological: Negative for dizziness, weakness, light-headedness, numbness and headaches.  Psychiatric/Behavioral: Negative.     Allergies  Ms contin; Penicillins; Tramadol; Erythrocin; and Ketorolac tromethamine  Home Medications   Current Outpatient Rx  Name  Route  Sig  Dispense  Refill  . Chlorphen-Pseudoephed-APAP (COLD MEDICINE PLUS PO)   Oral   Take 1-2 tablets by mouth daily as needed (for cold relief).         . naproxen sodium (ALEVE) 220 MG tablet   Oral   Take 440 mg by mouth daily as needed (for pain).          BP 134/76  Pulse 72  Temp(Src) 98.5 F (36.9 C) (Oral)  Resp 18  Ht 6\' 5"  (1.956 m)  Wt 200 lb (90.719 kg)  BMI 23.71 kg/m2  SpO2 98% Physical Exam  Nursing note and vitals reviewed. Constitutional: He is oriented to person, place, and time. He appears well-developed and well-nourished.  HENT:  Head: Normocephalic and atraumatic.  Eyes: Conjunctivae are normal.  Neck: Normal range of motion.  Cardiovascular: Normal rate, regular rhythm, normal heart sounds and intact distal pulses.   Pulmonary/Chest: Effort normal and breath sounds normal. He has no wheezes.  Musculoskeletal: Normal range of motion.  Neurological: He is alert and oriented to person, place, and time.  Skin: Skin is warm and dry.  Psychiatric: He has a normal mood and affect.    ED Course  Procedures (including critical care time)  Labs Review Labs Reviewed  URINE RAPID DRUG SCREEN (HOSP PERFORMED)   Imaging Review No results found.  EKG Interpretation   None       MDM   1. Encounter for drug screening    Pt was allowed uds check here.  He was placed in gown and male chaperone tech present during collection of urine sample.  Lab result printed for patient.  He left dept without concerns.    Burgess Amor, PA-C 03/04/13 1333

## 2013-03-10 NOTE — ED Provider Notes (Signed)
Medical screening examination/treatment/procedure(s) were performed by non-physician practitioner and as supervising physician I was immediately available for consultation/collaboration.  EKG Interpretation   None         Lyanne Co, MD 03/10/13 2306

## 2013-08-11 ENCOUNTER — Emergency Department (HOSPITAL_COMMUNITY)
Admission: EM | Admit: 2013-08-11 | Discharge: 2013-08-11 | Disposition: A | Discharge status: Home / Self Care | Payer: Self-pay | Attending: Emergency Medicine | Admitting: Emergency Medicine

## 2013-08-11 ENCOUNTER — Encounter (HOSPITAL_COMMUNITY): Payer: Self-pay | Admitting: Emergency Medicine

## 2013-08-11 DIAGNOSIS — Z8719 Personal history of other diseases of the digestive system: Secondary | ICD-10-CM | POA: Insufficient documentation

## 2013-08-11 DIAGNOSIS — F17200 Nicotine dependence, unspecified, uncomplicated: Secondary | ICD-10-CM | POA: Insufficient documentation

## 2013-08-11 DIAGNOSIS — L678 Other hair color and hair shaft abnormalities: Secondary | ICD-10-CM | POA: Insufficient documentation

## 2013-08-11 DIAGNOSIS — F411 Generalized anxiety disorder: Secondary | ICD-10-CM | POA: Insufficient documentation

## 2013-08-11 DIAGNOSIS — L739 Follicular disorder, unspecified: Secondary | ICD-10-CM

## 2013-08-11 DIAGNOSIS — Z880 Allergy status to penicillin: Secondary | ICD-10-CM | POA: Insufficient documentation

## 2013-08-11 DIAGNOSIS — F419 Anxiety disorder, unspecified: Secondary | ICD-10-CM

## 2013-08-11 DIAGNOSIS — L738 Other specified follicular disorders: Secondary | ICD-10-CM | POA: Insufficient documentation

## 2013-08-11 MED ORDER — SULFAMETHOXAZOLE-TRIMETHOPRIM 800-160 MG PO TABS: 1.0000 | ORAL_TABLET | Freq: Two times a day (BID) | ORAL | Status: DC

## 2013-08-11 MED ORDER — LORAZEPAM 1 MG PO TABS: 1.0000 mg | ORAL_TABLET | Freq: Three times a day (TID) | ORAL | Status: DC | PRN

## 2013-08-11 MED ORDER — LORAZEPAM 1 MG PO TABS
1.0000 mg | ORAL_TABLET | Freq: Once | ORAL | Status: AC
  Administered 2013-08-11: 1 mg via ORAL
  Filled 2013-08-11: qty 1

## 2013-08-11 NOTE — Discharge Instructions (Signed)
Generalized Anxiety Disorder Generalized anxiety disorder (GAD) is a mental disorder. It interferes with life functions, including relationships, work, and school. GAD is different from normal anxiety, which everyone experiences at some point in their lives in response to specific life events and activities. Normal anxiety actually helps us prepare for and get through these life events and activities. Normal anxiety goes away after the event or activity is over.  GAD causes anxiety that is not necessarily related to specific events or activities. It also causes excess anxiety in proportion to specific events or activities. The anxiety associated with GAD is also difficult to control. GAD can vary from mild to severe. People with severe GAD can have intense waves of anxiety with physical symptoms (panic attacks).  SYMPTOMS The anxiety and worry associated with GAD are difficult to control. This anxiety and worry are related to many life events and activities and also occur more days than not for 6 months or longer. People with GAD also have three or more of the following symptoms (one or more in children):  Restlessness.   Fatigue.  Difficulty concentrating.   Irritability.  Muscle tension.  Difficulty sleeping or unsatisfying sleep. DIAGNOSIS GAD is diagnosed through an assessment by your caregiver. Your caregiver will ask you questions aboutyour mood,physical symptoms, and events in your life. Your caregiver may ask you about your medical history and use of alcohol or drugs, including prescription medications. Your caregiver may also do a physical exam and blood tests. Certain medical conditions and the use of certain substances can cause symptoms similar to those associated with GAD. Your caregiver may refer you to a mental health specialist for further evaluation. TREATMENT The following therapies are usually used to treat GAD:   Medication Antidepressant medication usually is  prescribed for long-term daily control. Antianxiety medications may be added in severe cases, especially when panic attacks occur.   Talk therapy (psychotherapy) Certain types of talk therapy can be helpful in treating GAD by providing support, education, and guidance. A form of talk therapy called cognitive behavioral therapy can teach you healthy ways to think about and react to daily life events and activities.  Stress managementtechniques These include yoga, meditation, and exercise and can be very helpful when they are practiced regularly. A mental health specialist can help determine which treatment is best for you. Some people see improvement with one therapy. However, other people require a combination of therapies. Document Released: 08/26/2012 Document Reviewed: 08/26/2012 [facility] Patient Information 2014 [place], [place]. Folliculitis  Folliculitis is redness, soreness, and swelling (inflammation) of the hair follicles. This condition can occur anywhere on the body. People with weakened immune systems, diabetes, or obesity have a greater risk of getting folliculitis. CAUSES  Bacterial infection. This is the most common cause.  Fungal infection.  Viral infection.  Contact with certain chemicals, especially oils and tars. Long-term folliculitis can result from bacteria that live in the nostrils. The bacteria may trigger multiple outbreaks of folliculitis over time. SYMPTOMS Folliculitis most commonly occurs on the scalp, thighs, legs, back, buttocks, and areas where hair is shaved frequently. An early sign of folliculitis is a small, white or yellow, pus-filled, itchy lesion (pustule). These lesions appear on a red, inflamed follicle. They are usually less than 0.2 inches (5 mm) wide. When there is an infection of the follicle that goes deeper, it becomes a boil or furuncle. A group of closely packed boils creates a larger lesion (carbuncle). Carbuncles tend to occur in hairy, sweaty  areas of  the body. DIAGNOSIS  Your caregiver can usually tell what is wrong by doing a physical exam. A sample may be taken from one of the lesions and tested in a lab. This can help determine what is causing your folliculitis. TREATMENT  Treatment may include:  Applying warm compresses to the affected areas.  Taking antibiotic medicines orally or applying them to the skin.  Draining the lesions if they contain a large amount of pus or fluid.  Laser hair removal for cases of long-lasting folliculitis. This helps to prevent regrowth of the hair. HOME CARE INSTRUCTIONS  Apply warm compresses to the affected areas as directed by your caregiver.  If antibiotics are prescribed, take them as directed. Finish them even if you start to feel better.  You may take over-the-counter medicines to relieve itching.  Do not shave irritated skin.  Follow up with your caregiver as directed. SEEK IMMEDIATE MEDICAL CARE IF:   You have increasing redness, swelling, or pain in the affected area.  You have a fever. MAKE SURE YOU:  Understand these instructions.  Will watch your condition.  Will get help right away if you are not doing well or get worse. Document Released: 07/10/2001 Document Revised: 10/31/2011 Document Reviewed: 08/01/2011 [facility] Patient Information 2014 [place], [place].

## 2013-08-11 NOTE — ED Notes (Signed)
Says he feels shakey, "alot of stress"Also,has pimples on face that he wants checked.

## 2013-08-11 NOTE — ED Notes (Signed)
Pt reports has been having frequent panic attacks and has been having multiple sores on face recently.  Pt says is under a lot of stress because doesn't have a job and has a baby on the way.  Pt says is depressed but denies any SI or HI.

## 2013-08-11 NOTE — ED Provider Notes (Signed)
CSN: [id number]     Arrival date & time 08/11/13  1843 History   First MD Initiated Contact with Patient 08/11/13 1855     Chief Complaint  Patient presents with  . Panic Attack     (Consider location/radiation/quality/duration/timing/severity/associated sxs/prior Treatment) HPI Comments: Patient presents to the ED with a chief complaint of anxiety.  He states that he has been increasingly anxious, and has occasional panic attacks.  He thinks that his symptoms are related to increased stress at home.  He recently lost his job, insurance, and found out that his wife is expecting.  He has not tried anything to alleviate his symptoms.  There are no aggravating or alleviating factors.  He denies any chest pain or SOB.  Denies any SI or HI.  Additionally, he complains of a sore on his face, which he thinks is infected.  The history is provided by the patient. No language interpreter was used.    Past Medical History  Diagnosis Date  . Back pain   . Headache(784.0)     occ headaches  . GERD (gastroesophageal reflux disease)   . Substance abuse    Past Surgical History  Procedure Laterality Date  . Knee arthroscopy    . Lumbar laminectomy/decompression microdiscectomy  06/05/2011    Procedure: LUMBAR LAMINECTOMY/DECOMPRESSION MICRODISCECTOMY;  Surgeon: Temple Pacini, MD;  Location: [facility];  Service: Neurosurgery;  Laterality: Right;  Right Lumbar five-Sacral one laminectomy/microdiscectomy  . Back surgery     Family History  Problem Relation Age of Onset  . Diabetes Mother   . Hypertension Mother   . Diabetes Father    History  Substance Use Topics  . Smoking status: Current Every Day Smoker -- 1.00 packs/day  . Smokeless tobacco: Not on file  . Alcohol Use: No    Review of Systems  Constitutional: Negative for fever and chills.  Respiratory: Negative for shortness of breath.   Cardiovascular: Negative for chest pain.  Gastrointestinal: Negative for nausea, vomiting,  diarrhea and constipation.  Genitourinary: Negative for dysuria.  Skin: Positive for wound.  Psychiatric/Behavioral: Negative for self-injury. The patient is nervous/anxious.       Allergies  Ms contin; Penicillins; Tramadol; Erythrocin; and Ketorolac tromethamine  Home Medications   Current Outpatient Rx  Name  Route  Sig  Dispense  Refill  . LORazepam (ATIVAN) 1 MG tablet   Oral   Take 1 tablet (1 mg total) by mouth every 8 (eight) hours as needed for anxiety.   5 tablet   0   . naproxen sodium (ALEVE) 220 MG tablet   Oral   Take 440 mg by mouth daily as needed (for pain).         Marland Kitchen sulfamethoxazole-trimethoprim (SEPTRA DS) 800-160 MG per tablet   Oral   Take 1 tablet by mouth every 12 (twelve) hours.   20 tablet   0    BP 126/77  Pulse 88  Temp(Src) 98.2 F (36.8 C) (Oral)  Resp 24  Ht 6\' 5"  (1.956 m)  Wt 200 lb (90.719 kg)  BMI 23.71 kg/m2  SpO2 98% Physical Exam  Nursing note and vitals reviewed. Constitutional: He is oriented to person, place, and time. He appears well-developed and well-nourished.  HENT:  Head: Normocephalic and atraumatic.  Eyes: Conjunctivae and EOM are normal. Pupils are equal, round, and reactive to light. Right eye exhibits no discharge. Left eye exhibits no discharge. No scleral icterus.  Neck: Normal range of motion. Neck supple. No JVD present.  Cardiovascular: Normal rate, regular rhythm and normal heart sounds.  Exam reveals no gallop and no friction rub.   No murmur heard. Pulmonary/Chest: Effort normal and breath sounds normal. No respiratory distress. He has no wheezes. He has no rales. He exhibits no tenderness.  Abdominal: Soft. He exhibits no distension and no mass. There is no tenderness. There is no rebound and no guarding.  Musculoskeletal: Normal range of motion. He exhibits no edema and no tenderness.  Neurological: He is alert and oriented to person, place, and time.  Skin: Skin is warm and dry.  Small area of  folliculitis on left side of jaw, no abscess, no streaking  Psychiatric: He has a normal mood and affect. His behavior is normal. Judgment and thought content normal.    ED Course  Procedures (including critical care time) Labs Review Labs Reviewed - No data to display Imaging Review No results found.   EKG Interpretation None      MDM   Final diagnoses:  Anxiety  Folliculitis    Patient with anxiety due to stress in life.  Patient encouraged to follow-up with behavioral health/[facility].  Patient understands and agrees with the plan.  He is not exhibiting SI or HI.  He feels much better in the ED after receiving 1mg  of ativan.  Additionally, he has a small area of folliculitis on his face.  I will give him some bactrim, as he cannot afford doxycycline and has a penicillin allergy.    Roxy Horsemanobert Landyn Buckalew, PA-C 08/11/13 2339

## 2013-08-12 ENCOUNTER — Emergency Department (HOSPITAL_COMMUNITY)
Admission: EM | Admit: 2013-08-12 | Discharge: 2013-08-12 | Disposition: A | Discharge status: Home / Self Care | Payer: Self-pay | Attending: Emergency Medicine | Admitting: Emergency Medicine

## 2013-08-12 ENCOUNTER — Emergency Department (HOSPITAL_COMMUNITY): Payer: Self-pay

## 2013-08-12 ENCOUNTER — Encounter (HOSPITAL_COMMUNITY): Payer: Self-pay | Admitting: Emergency Medicine

## 2013-08-12 DIAGNOSIS — Z792 Long term (current) use of antibiotics: Secondary | ICD-10-CM | POA: Insufficient documentation

## 2013-08-12 DIAGNOSIS — S4980XA Other specified injuries of shoulder and upper arm, unspecified arm, initial encounter: Secondary | ICD-10-CM | POA: Insufficient documentation

## 2013-08-12 DIAGNOSIS — S46909A Unspecified injury of unspecified muscle, fascia and tendon at shoulder and upper arm level, unspecified arm, initial encounter: Secondary | ICD-10-CM | POA: Insufficient documentation

## 2013-08-12 DIAGNOSIS — Y92410 Unspecified street and highway as the place of occurrence of the external cause: Secondary | ICD-10-CM | POA: Insufficient documentation

## 2013-08-12 DIAGNOSIS — Z8719 Personal history of other diseases of the digestive system: Secondary | ICD-10-CM | POA: Insufficient documentation

## 2013-08-12 DIAGNOSIS — S199XXA Unspecified injury of neck, initial encounter: Secondary | ICD-10-CM

## 2013-08-12 DIAGNOSIS — F17200 Nicotine dependence, unspecified, uncomplicated: Secondary | ICD-10-CM | POA: Insufficient documentation

## 2013-08-12 DIAGNOSIS — Y9389 Activity, other specified: Secondary | ICD-10-CM | POA: Insufficient documentation

## 2013-08-12 DIAGNOSIS — S0993XA Unspecified injury of face, initial encounter: Secondary | ICD-10-CM | POA: Insufficient documentation

## 2013-08-12 DIAGNOSIS — F411 Generalized anxiety disorder: Secondary | ICD-10-CM | POA: Insufficient documentation

## 2013-08-12 DIAGNOSIS — M25519 Pain in unspecified shoulder: Secondary | ICD-10-CM

## 2013-08-12 DIAGNOSIS — Z880 Allergy status to penicillin: Secondary | ICD-10-CM | POA: Insufficient documentation

## 2013-08-12 MED ORDER — LORAZEPAM 1 MG PO TABS: 1.0000 mg | ORAL_TABLET | Freq: Three times a day (TID) | ORAL | Status: DC | PRN

## 2013-08-12 MED ORDER — OXYCODONE-ACETAMINOPHEN 5-325 MG PO TABS: 2.0000 | ORAL_TABLET | Freq: Four times a day (QID) | ORAL | Status: DC | PRN

## 2013-08-12 MED ORDER — OXYCODONE-ACETAMINOPHEN 5-325 MG PO TABS
2.0000 | ORAL_TABLET | Freq: Once | ORAL | Status: AC
  Administered 2013-08-12: 2 via ORAL
  Filled 2013-08-12: qty 2
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
## 2013-08-12 NOTE — ED Notes (Signed)
Patient given discharge instruction, verbalized understand. Patient ambulatory out of the department.  

## 2013-08-12 NOTE — Discharge Instructions (Signed)
Shoulder Sprain  A shoulder sprain is the result of damage to the tough, fiber-like tissues (ligaments) that help hold your shoulder in place. The ligaments may be stretched or torn. Besides the main shoulder joint (the ball and socket), there are several smaller joints that connect the bones in this area. A sprain usually involves one of those joints. Most often it is the acromioclavicular (or AC) joint. That is the joint that connects the collarbone (clavicle) and the shoulder blade (scapula) at the top point of the shoulder blade (acromion).  A shoulder sprain is a mild form of what is called a shoulder separation. Recovering from a shoulder sprain may take some time. For some, pain lingers for several months. Most people recover without long term problems.  CAUSES    A shoulder sprain is usually caused by some kind of trauma. This might be:   Falling on an outstretched arm.   Being hit hard on the shoulder.   Twisting the arm.   Shoulder sprains are more likely to occur in people who:   Play sports.   Have balance or coordination problems.  SYMPTOMS    Pain when you move your shoulder.   Limited ability to move the shoulder.   Swelling and tenderness on top of the shoulder.   Redness or warmth in the shoulder.   Bruising.   A change in the shape of the shoulder.  DIAGNOSIS   Your healthcare provider may:   Ask about your symptoms.   Ask about recent activity that might have caused those symptoms.   Examine your shoulder. You may be asked to do simple exercises to test movement. The other shoulder will be examined for comparison.   Order some tests that provide a look inside the body. They can show the extent of the injury. The tests could include:   X-rays.   CT (computed tomography) scan.   MRI (magnetic resonance imaging) scan.  RISKS AND COMPLICATIONS   Loss of full shoulder motion.   Ongoing shoulder pain.  TREATMENT   How long it takes to recover from a shoulder sprain depends on how  severe it was. Treatment options may include:   Rest. You should not use the arm or shoulder until it heals.   Ice. For 2 or 3 days after the injury, put an ice pack on the shoulder up to 4 times a day. It should stay on for 15 to 20 minutes each time. Wrap the ice in a towel so it does not touch your skin.   Over-the-counter medicine to relieve pain.   A sling or brace. This will keep the arm still while the shoulder is healing.   Physical therapy or rehabilitation exercises. These will help you regain strength and motion. Ask your healthcare provider when it is OK to begin these exercises.   Surgery. The need for surgery is rare with a sprained shoulder, but some people may need surgery to keep the joint in place and reduce pain.  HOME CARE INSTRUCTIONS    Ask your healthcare provider about what you should and should not do while your shoulder heals.   Make sure you know how to apply ice to the correct area of your shoulder.   Talk with your healthcare provider about which medications should be used for pain and swelling.   If rehabilitation therapy will be needed, ask your healthcare provider to refer you to a therapist. If it is not recommended, then ask about at-home exercises. Find   Revised: 07/24/2011 Document Reviewed: 09/17/2008 [facility] Patient Information 2014 [place], [place].    Oxycodone for mod-severe pain only Take ibuprofen Massage or heat therapy Start stretching exercises

## 2013-08-12 NOTE — ED Provider Notes (Signed)
Medical screening examination/treatment/procedure(s) were performed by non-physician practitioner and as supervising physician I was immediately available for consultation/collaboration.  Tahji Nubieber L Carson Bogden, MD 08/12/13 0036 
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
## 2013-08-14 NOTE — ED Provider Notes (Signed)
Medical screening examination/treatment/procedure(s) were performed by non-physician practitioner and as supervising physician I was immediately available for consultation/collaboration.   EKG Interpretation None       Donnetta HutchingBrian Tehya Leath, MD 08/14/13 1609

## 2013-08-22 ENCOUNTER — Emergency Department (HOSPITAL_COMMUNITY)
Admission: EM | Admit: 2013-08-22 | Discharge: 2013-08-22 | Disposition: A | Discharge status: Home / Self Care | Payer: Self-pay | Attending: Emergency Medicine | Admitting: Emergency Medicine

## 2013-08-22 ENCOUNTER — Encounter (HOSPITAL_COMMUNITY): Payer: Self-pay | Admitting: Emergency Medicine

## 2013-08-22 DIAGNOSIS — F430 Acute stress reaction: Secondary | ICD-10-CM | POA: Insufficient documentation

## 2013-08-22 DIAGNOSIS — F17200 Nicotine dependence, unspecified, uncomplicated: Secondary | ICD-10-CM | POA: Insufficient documentation

## 2013-08-22 DIAGNOSIS — F411 Generalized anxiety disorder: Secondary | ICD-10-CM | POA: Insufficient documentation

## 2013-08-22 NOTE — ED Notes (Signed)
"  MY stress level is thru the roof" says he has been here recently for stress and anxiety.  Out of his meds he was given here. Has appt with [facility] on Monday.

## 2013-09-24 ENCOUNTER — Emergency Department (HOSPITAL_COMMUNITY)
Admission: EM | Admit: 2013-09-24 | Discharge: 2013-09-24 | Disposition: A | Discharge status: Home / Self Care | Payer: No Typology Code available for payment source | Attending: Emergency Medicine | Admitting: Emergency Medicine

## 2013-09-24 ENCOUNTER — Encounter (HOSPITAL_COMMUNITY): Payer: Self-pay | Admitting: Emergency Medicine

## 2013-09-24 ENCOUNTER — Emergency Department (HOSPITAL_COMMUNITY): Payer: No Typology Code available for payment source

## 2013-09-24 DIAGNOSIS — S161XXA Strain of muscle, fascia and tendon at neck level, initial encounter: Secondary | ICD-10-CM

## 2013-09-24 DIAGNOSIS — Z880 Allergy status to penicillin: Secondary | ICD-10-CM | POA: Insufficient documentation

## 2013-09-24 DIAGNOSIS — IMO0002 Reserved for concepts with insufficient information to code with codable children: Secondary | ICD-10-CM | POA: Insufficient documentation

## 2013-09-24 DIAGNOSIS — F17200 Nicotine dependence, unspecified, uncomplicated: Secondary | ICD-10-CM | POA: Insufficient documentation

## 2013-09-24 DIAGNOSIS — Y92410 Unspecified street and highway as the place of occurrence of the external cause: Secondary | ICD-10-CM | POA: Insufficient documentation

## 2013-09-24 DIAGNOSIS — Z791 Long term (current) use of non-steroidal anti-inflammatories (NSAID): Secondary | ICD-10-CM | POA: Insufficient documentation

## 2013-09-24 DIAGNOSIS — Y9389 Activity, other specified: Secondary | ICD-10-CM | POA: Insufficient documentation

## 2013-09-24 DIAGNOSIS — S069X9A Unspecified intracranial injury with loss of consciousness of unspecified duration, initial encounter: Secondary | ICD-10-CM

## 2013-09-24 DIAGNOSIS — Z8719 Personal history of other diseases of the digestive system: Secondary | ICD-10-CM | POA: Insufficient documentation

## 2013-09-24 DIAGNOSIS — Z79899 Other long term (current) drug therapy: Secondary | ICD-10-CM | POA: Insufficient documentation

## 2013-09-24 DIAGNOSIS — Z792 Long term (current) use of antibiotics: Secondary | ICD-10-CM | POA: Insufficient documentation

## 2013-09-24 DIAGNOSIS — S139XXA Sprain of joints and ligaments of unspecified parts of neck, initial encounter: Secondary | ICD-10-CM | POA: Insufficient documentation

## 2013-09-24 DIAGNOSIS — S060X9A Concussion with loss of consciousness of unspecified duration, initial encounter: Secondary | ICD-10-CM | POA: Insufficient documentation

## 2013-09-24 MED ORDER — OXYCODONE-ACETAMINOPHEN 5-325 MG PO TABS
1.0000 | ORAL_TABLET | Freq: Once | ORAL | Status: AC
  Administered 2013-09-24: 1 via ORAL
  Filled 2013-09-24: qty 1

## 2013-09-24 MED ORDER — OXYCODONE-ACETAMINOPHEN 5-325 MG PO TABS: 1.0000 | ORAL_TABLET | ORAL | Status: DC | PRN
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
## 2013-09-30 NOTE — ED Provider Notes (Signed)
Medical screening examination/treatment/procedure(s) were performed by non-physician practitioner and as supervising physician I was immediately available for consultation/collaboration.   EKG Interpretation None      Devoria AlbeIva Chibueze Beasley, MD, Armando GangFACEP   Ward GivensIva L Kamillah Didonato, MD 09/30/13 1310

## 2013-10-03 ENCOUNTER — Emergency Department (HOSPITAL_COMMUNITY)
Admission: EM | Admit: 2013-10-03 | Discharge: 2013-10-03 | Disposition: A | Discharge status: Home / Self Care | Payer: Self-pay | Attending: Emergency Medicine | Admitting: Emergency Medicine

## 2013-10-03 ENCOUNTER — Encounter (HOSPITAL_COMMUNITY): Payer: Self-pay | Admitting: Emergency Medicine

## 2013-10-03 DIAGNOSIS — Z79899 Other long term (current) drug therapy: Secondary | ICD-10-CM | POA: Insufficient documentation

## 2013-10-03 DIAGNOSIS — Y92410 Unspecified street and highway as the place of occurrence of the external cause: Secondary | ICD-10-CM | POA: Insufficient documentation

## 2013-10-03 DIAGNOSIS — S139XXA Sprain of joints and ligaments of unspecified parts of neck, initial encounter: Secondary | ICD-10-CM | POA: Insufficient documentation

## 2013-10-03 DIAGNOSIS — F17200 Nicotine dependence, unspecified, uncomplicated: Secondary | ICD-10-CM | POA: Insufficient documentation

## 2013-10-03 DIAGNOSIS — Z8719 Personal history of other diseases of the digestive system: Secondary | ICD-10-CM | POA: Insufficient documentation

## 2013-10-03 DIAGNOSIS — Z880 Allergy status to penicillin: Secondary | ICD-10-CM | POA: Insufficient documentation

## 2013-10-03 DIAGNOSIS — S161XXA Strain of muscle, fascia and tendon at neck level, initial encounter: Secondary | ICD-10-CM

## 2013-10-03 DIAGNOSIS — Y9389 Activity, other specified: Secondary | ICD-10-CM | POA: Insufficient documentation

## 2013-10-03 MED ORDER — OXYCODONE-ACETAMINOPHEN 5-325 MG PO TABS: 1.0000 | ORAL_TABLET | ORAL | Status: DC | PRN
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
## 2013-10-03 NOTE — Discharge Instructions (Signed)
Cervical Sprain °A cervical sprain is when the tissues (ligaments) that hold the neck bones in place stretch or tear. °HOME CARE  °· Put ice on the injured area. °· Put ice in a plastic bag. °· Place a towel between your skin and the bag. °· Leave the ice on for 15 20 minutes, 3 4 times a day. °· You may have been given a collar to wear. This collar keeps your neck from moving while you heal. °· Do not take the collar off unless told by your doctor. °· If you have long hair, keep it outside of the collar. °· Ask your doctor before changing the position of your collar. You may need to change its position over time to make it more comfortable. °· If you are allowed to take off the collar for cleaning or bathing, follow your doctor's instructions on how to do it safely. °· Keep your collar clean by wiping it with mild soap and water. Dry it completely. If the collar has removable pads, remove them every 1 2 days to hand wash them with soap and water. Allow them to air dry. They should be dry before you wear them in the collar. °· Do not drive while wearing the collar. °· Only take medicine as told by your doctor. °· Keep all doctor visits as told. °· Keep all physical therapy visits as told. °· Adjust your work station so that you have good posture while you work. °· Avoid positions and activities that make your problems worse. °· Warm up and stretch before being active. °GET HELP IF: °· Your pain is not controlled with medicine. °· You cannot take less pain medicine over time as planned. °· Your activity level does not improve as expected. °GET HELP RIGHT AWAY IF:  °· You are bleeding. °· Your stomach is upset. °· You have an allergic reaction to your medicine. °· You develop new problems that you cannot explain. °· You lose feeling (become numb) or you cannot move any part of your body (paralysis). °· You have tingling or weakness in any part of your body. °· Your symptoms get worse. Symptoms include: °· Pain,  soreness, stiffness, puffiness (swelling), or a burning feeling in your neck. °· Pain when your neck is touched. °· Shoulder or upper back pain. °· Limited ability to move your neck. °· Headache. °· Dizziness. °· Your hands or arms feel week, lose feeling, or tingle. °· Muscle spasms. °· Difficulty swallowing or chewing. °MAKE SURE YOU:  °· Understand these instructions. °· Will watch your condition. °· Will get help right away if you are not doing well or get worse. °Document Released: 10/18/2007 Document Revised: 01/01/2013 Document Reviewed: 11/06/2012 °[employer]® Patient Information ©2014 [employer]. ° °

## 2013-10-03 NOTE — ED Notes (Signed)
Cont neck pain,says he is out of his percocet. Has been taking 2 at a time.

## 2013-10-04 NOTE — ED Provider Notes (Signed)
Medical screening examination/treatment/procedure(s) were performed by non-physician practitioner and as supervising physician I was immediately available for consultation/collaboration.   EKG Interpretation None        Laray Anger, DO 10/04/13 1121

## 2013-11-20 ENCOUNTER — Encounter (HOSPITAL_COMMUNITY): Payer: Self-pay | Admitting: Emergency Medicine

## 2013-11-20 ENCOUNTER — Inpatient Hospital Stay (HOSPITAL_COMMUNITY)
Admission: EM | Admit: 2013-11-20 | Discharge: 2013-11-21 | DRG: 917 | Payer: Self-pay | Attending: Family Medicine | Admitting: Family Medicine

## 2013-11-20 DIAGNOSIS — Z833 Family history of diabetes mellitus: Secondary | ICD-10-CM

## 2013-11-20 DIAGNOSIS — M542 Cervicalgia: Secondary | ICD-10-CM | POA: Diagnosis present

## 2013-11-20 DIAGNOSIS — T403X4A Poisoning by methadone, undetermined, initial encounter: Principal | ICD-10-CM | POA: Diagnosis present

## 2013-11-20 DIAGNOSIS — K219 Gastro-esophageal reflux disease without esophagitis: Secondary | ICD-10-CM | POA: Diagnosis present

## 2013-11-20 DIAGNOSIS — Z8249 Family history of ischemic heart disease and other diseases of the circulatory system: Secondary | ICD-10-CM

## 2013-11-20 DIAGNOSIS — J9600 Acute respiratory failure, unspecified whether with hypoxia or hypercapnia: Secondary | ICD-10-CM

## 2013-11-20 DIAGNOSIS — G8929 Other chronic pain: Secondary | ICD-10-CM | POA: Diagnosis present

## 2013-11-20 DIAGNOSIS — F1910 Other psychoactive substance abuse, uncomplicated: Secondary | ICD-10-CM

## 2013-11-20 DIAGNOSIS — F1210 Cannabis abuse, uncomplicated: Secondary | ICD-10-CM | POA: Diagnosis present

## 2013-11-20 DIAGNOSIS — R51 Headache: Secondary | ICD-10-CM | POA: Diagnosis not present

## 2013-11-20 DIAGNOSIS — T40602A Poisoning by unspecified narcotics, intentional self-harm, initial encounter: Secondary | ICD-10-CM | POA: Insufficient documentation

## 2013-11-20 DIAGNOSIS — F1110 Opioid abuse, uncomplicated: Secondary | ICD-10-CM | POA: Diagnosis present

## 2013-11-20 DIAGNOSIS — G929 Unspecified toxic encephalopathy: Secondary | ICD-10-CM | POA: Diagnosis present

## 2013-11-20 DIAGNOSIS — T50903A Poisoning by unspecified drugs, medicaments and biological substances, assault, initial encounter: Secondary | ICD-10-CM

## 2013-11-20 DIAGNOSIS — G9340 Encephalopathy, unspecified: Secondary | ICD-10-CM

## 2013-11-20 DIAGNOSIS — T400X1A Poisoning by opium, accidental (unintentional), initial encounter: Secondary | ICD-10-CM

## 2013-11-20 DIAGNOSIS — G92 Toxic encephalopathy: Secondary | ICD-10-CM | POA: Diagnosis present

## 2013-11-20 DIAGNOSIS — F1410 Cocaine abuse, uncomplicated: Secondary | ICD-10-CM | POA: Diagnosis present

## 2013-11-20 DIAGNOSIS — F1310 Sedative, hypnotic or anxiolytic abuse, uncomplicated: Secondary | ICD-10-CM | POA: Diagnosis present

## 2013-11-20 DIAGNOSIS — T40601A Poisoning by unspecified narcotics, accidental (unintentional), initial encounter: Secondary | ICD-10-CM

## 2013-11-20 DIAGNOSIS — F17200 Nicotine dependence, unspecified, uncomplicated: Secondary | ICD-10-CM | POA: Diagnosis present

## 2013-11-20 DIAGNOSIS — T40603A Poisoning by unspecified narcotics, assault, initial encounter: Secondary | ICD-10-CM

## 2013-11-20 DIAGNOSIS — T403X1A Poisoning by methadone, accidental (unintentional), initial encounter: Secondary | ICD-10-CM | POA: Diagnosis present

## 2013-11-20 LAB — RAPID URINE DRUG SCREEN, HOSP PERFORMED
Amphetamines: NOT DETECTED
BENZODIAZEPINES: POSITIVE — AB
Barbiturates: NOT DETECTED
COCAINE: POSITIVE — AB
OPIATES: POSITIVE — AB
Tetrahydrocannabinol: POSITIVE — AB

## 2013-11-20 LAB — COMPREHENSIVE METABOLIC PANEL
ALBUMIN: 4.6 g/dL (ref 3.5–5.2)
ALT: 11 U/L (ref 0–53)
ANION GAP: 11 (ref 5–15)
AST: 23 U/L (ref 0–37)
Alkaline Phosphatase: 57 U/L (ref 39–117)
BUN: 16 mg/dL (ref 6–23)
CALCIUM: 9.3 mg/dL (ref 8.4–10.5)
CO2: 31 mEq/L (ref 19–32)
CREATININE: 0.76 mg/dL (ref 0.50–1.35)
Chloride: 102 mEq/L (ref 96–112)
GFR calc Af Amer: >90 mL/min (ref >90)
GFR calc non Af Amer: >90 mL/min (ref >90)
Glucose, Bld: 81 mg/dL (ref 70–99)
Potassium: 4.5 mEq/L (ref 3.7–5.3)
Sodium: 144 mEq/L (ref 137–147)
Total Bilirubin: <0.2 mg/dL — ABNORMAL LOW (ref 0.3–1.2)
Total Protein: 7.8 g/dL (ref 6.0–8.3)

## 2013-11-20 LAB — CBC WITH DIFFERENTIAL/PLATELET
Basophils Absolute: 0 10*3/uL (ref 0.0–0.1)
Basophils Relative: 0 % (ref 0–1)
Eosinophils Absolute: 0.2 10*3/uL (ref 0.0–0.7)
Eosinophils Relative: 2 % (ref 0–5)
HCT: 41.5 % (ref 39.0–52.0)
HEMOGLOBIN: 13.9 g/dL (ref 13.0–17.0)
Lymphocytes Relative: 21 % (ref 12–46)
Lymphs Abs: 1.9 10*3/uL (ref 0.7–4.0)
MCH: 31.6 pg (ref 26.0–34.0)
MCHC: 33.5 g/dL (ref 30.0–36.0)
MCV: 94.3 fL (ref 78.0–100.0)
MONO ABS: 0.5 10*3/uL (ref 0.1–1.0)
MONOS PCT: 6 % (ref 3–12)
Neutro Abs: 6.6 10*3/uL (ref 1.7–7.7)
Neutrophils Relative %: 71 % (ref 43–77)
Platelets: 252 10*3/uL (ref 150–400)
RBC: 4.4 MIL/uL (ref 4.22–5.81)
RDW: 13.2 % (ref 11.5–15.5)
WBC: 9.3 10*3/uL (ref 4.0–10.5)

## 2013-11-20 LAB — MRSA PCR SCREENING: MRSA by PCR: NEGATIVE

## 2013-11-20 LAB — ETHANOL

## 2013-11-20 MED ORDER — ONDANSETRON HCL 4 MG/2ML IJ SOLN
4.0000 mg | Freq: Once | INTRAMUSCULAR | Status: AC
  Administered 2013-11-20: 4 mg via INTRAVENOUS
  Filled 2013-11-20: qty 2

## 2013-11-20 MED ORDER — NALOXONE HCL 1 MG/ML IJ SOLN
1.0000 mg | Freq: Once | INTRAMUSCULAR | Status: AC
  Administered 2013-11-20: 1 mg via INTRAVENOUS

## 2013-11-20 MED ORDER — NALOXONE HCL 1 MG/ML IJ SOLN
INTRAMUSCULAR | Status: AC
  Filled 2013-11-20: qty 2

## 2013-11-20 MED ORDER — ATROPINE SULFATE 1 MG/ML IJ SOLN
INTRAMUSCULAR | Status: AC
  Filled 2013-11-20: qty 1

## 2013-11-20 MED ORDER — NALOXONE HCL 0.4 MG/ML IJ SOLN
0.4000 mg | INTRAMUSCULAR | Status: DC | PRN
  Administered 2013-11-20: 0.4 mg via INTRAVENOUS
  Filled 2013-11-20 (×2): qty 1

## 2013-11-20 MED ORDER — ONDANSETRON HCL 4 MG/2ML IJ SOLN: 4.0000 mg | Freq: Four times a day (QID) | INTRAMUSCULAR | Status: DC | PRN

## 2013-11-20 MED ORDER — ONDANSETRON HCL 4 MG/2ML IJ SOLN
INTRAMUSCULAR | Status: AC
  Administered 2013-11-20: 4 mg via INTRAVENOUS
  Filled 2013-11-20: qty 2

## 2013-11-20 MED ORDER — ONDANSETRON HCL 4 MG PO TABS: 4.0000 mg | ORAL_TABLET | Freq: Four times a day (QID) | ORAL | Status: DC | PRN

## 2013-11-20 MED ORDER — POTASSIUM CHLORIDE IN NACL 20-0.9 MEQ/L-% IV SOLN
INTRAVENOUS | Status: DC
  Administered 2013-11-20 – 2013-11-21 (×2): via INTRAVENOUS

## 2013-11-20 MED ORDER — SODIUM CHLORIDE 0.9 % IJ SOLN
3.0000 mL | Freq: Two times a day (BID) | INTRAMUSCULAR | Status: DC
  Administered 2013-11-20: 3 mL via INTRAVENOUS

## 2013-11-20 MED ORDER — SODIUM CHLORIDE 0.9 % IV BOLUS (SEPSIS)
1000.0000 mL | Freq: Once | INTRAVENOUS | Status: AC
  Administered 2013-11-20: 1000 mL via INTRAVENOUS

## 2013-11-20 MED ORDER — ONDANSETRON HCL 4 MG/2ML IJ SOLN
4.0000 mg | Freq: Once | INTRAMUSCULAR | Status: AC
  Administered 2013-11-20: 4 mg via INTRAVENOUS

## 2013-11-20 MED ORDER — NALOXONE HCL 1 MG/ML IJ SOLN
2.0000 mg | Freq: Once | INTRAMUSCULAR | Status: AC
  Administered 2013-11-20: 2 mg via INTRAVENOUS

## 2013-11-20 MED ORDER — HEPARIN SODIUM (PORCINE) 5000 UNIT/ML IJ SOLN
5000.0000 [IU] | Freq: Three times a day (TID) | INTRAMUSCULAR | Status: DC
  Administered 2013-11-20 – 2013-11-21 (×3): 5000 [IU] via SUBCUTANEOUS
  Filled 2013-11-20 (×3): qty 1
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
## 2013-11-20 NOTE — ED Notes (Signed)
Pt more drowsy at this time, alert to soft verbal, pt sats decreasing with brief periods of apnea to 88% on RA.  Placed pt back on 2L/min [place] and encouraged pt to stay awake and take deep breaths.  Pt verbalized understanding.
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
## 2013-11-20 NOTE — ED Notes (Signed)
Pt given 1mg  narcan at 1529. Pt responded well with RR of 18 and O2 sats of 100. Pt more alert/less drowsy.

## 2013-11-20 NOTE — ED Notes (Signed)
Pt began actively vomiting.  meds given.

## 2013-11-20 NOTE — ED Notes (Signed)
Beeped to [phone].Gosrani 

## 2013-11-20 NOTE — ED Notes (Signed)
Upon initial assessment, pt alert, very drowsy, able to follow commands.  Reports taking 1 methadone pta.  Denies SI/HI.  During assessment, pt's drowsiness increased, unable to hold head up or eyes open, does not complete sentences.  Speech became incomprehensible, pt having periods of apnea requiring verbal stimulation to breath.  sats initially on RA 84%, placed on O2 at 2L/min with no response.  Placed on NRB at 15L/min, sats increased to 98% still experiencing periods of apnea.  Pt unable to cough on command at this point, not alert to verbal, but alert to painful stimuli.  edp notified and at bedside with verbal order for Narcan 2mg  IV.  meds given with immediate response, pt alert and oriented, pupils dilated, O2 removed and pt maintaining sats at 98% on RA.  Pt denies taking any other medications other than methadone initially.  Now pt admits to taking 9 methadone tablets, 10mg  each, with total being 90mg .  Pt in custody of [employer] at this time with both arms cuffed to stretcher.  Pt cooperative.  NSR on monitor.  Nad noted.
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
## 2013-11-20 NOTE — ED Notes (Signed)
O2 removed by edp - sats drop to 88% on RA while pt sleeps, sats increase with verbal stimulation.  meds ordered per edp.  Pt given water per request, verified with edp.

## 2013-11-20 NOTE — Progress Notes (Signed)
Pt only responding to painful stimuli; unable to answer questions or follow commands. HR in 50's. Gave Narcan; pt responding appropriately though lethargic.

## 2013-11-21 DIAGNOSIS — J9600 Acute respiratory failure, unspecified whether with hypoxia or hypercapnia: Secondary | ICD-10-CM

## 2013-11-21 DIAGNOSIS — T400X1A Poisoning by opium, accidental (unintentional), initial encounter: Secondary | ICD-10-CM

## 2013-11-21 DIAGNOSIS — G9340 Encephalopathy, unspecified: Secondary | ICD-10-CM

## 2013-11-21 DIAGNOSIS — F1910 Other psychoactive substance abuse, uncomplicated: Secondary | ICD-10-CM

## 2013-11-21 LAB — CBC
HCT: 36.8 % — ABNORMAL LOW (ref 39.0–52.0)
Hemoglobin: 12.3 g/dL — ABNORMAL LOW (ref 13.0–17.0)
MCH: 31.9 pg (ref 26.0–34.0)
MCHC: 33.4 g/dL (ref 30.0–36.0)
MCV: 95.6 fL (ref 78.0–100.0)
PLATELETS: 203 10*3/uL (ref 150–400)
RBC: 3.85 MIL/uL — ABNORMAL LOW (ref 4.22–5.81)
RDW: 13.3 % (ref 11.5–15.5)
WBC: 7.6 10*3/uL (ref 4.0–10.5)

## 2013-11-21 LAB — COMPREHENSIVE METABOLIC PANEL
ALK PHOS: 43 U/L (ref 39–117)
ALT: 9 U/L (ref 0–53)
ANION GAP: 8 (ref 5–15)
AST: 16 U/L (ref 0–37)
Albumin: 3.6 g/dL (ref 3.5–5.2)
BILIRUBIN TOTAL: 0.4 mg/dL (ref 0.3–1.2)
BUN: 10 mg/dL (ref 6–23)
CHLORIDE: 106 meq/L (ref 96–112)
CO2: 31 meq/L (ref 19–32)
Calcium: 8.4 mg/dL (ref 8.4–10.5)
Creatinine, Ser: 0.7 mg/dL (ref 0.50–1.35)
GLUCOSE: 82 mg/dL (ref 70–99)
POTASSIUM: 3.9 meq/L (ref 3.7–5.3)
Sodium: 145 mEq/L (ref 137–147)
TOTAL PROTEIN: 6 g/dL (ref 6.0–8.3)

## 2013-11-21 NOTE — Progress Notes (Signed)
Pt a/o.vss. Saline locked removed. Discharge instructions given to officer at bedside. Pt left floor ambulatory with officer.
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
## 2013-11-21 NOTE — Progress Notes (Signed)
UR chart review completed.  

## 2013-11-21 NOTE — Progress Notes (Signed)
PROGRESS NOTE  Ryan Valentine AOZ:[id number] DOB: 04-Dec-1980 DOA: 11/20/2013 PCP: No PCP Per Patient  Summary: 33 year old man who presented to the emergency department for history of lethargy. On day of admission 7/10 he was arrested for domestic assault, he then became drowsy with near respiratory arrest and subsequently admitted to intentionally ingesting 9 methadone tablets, 10mg  each, to prevent being caught with them. No suicidal ideations. He required several doses of Narcan to prevent respiratory arrest and was admitted for further evaluation and treatment.   Assessment/Plan: 1. Acute hypoxic respiratory failure secondary to methadone overdose. Resolved. 2. Acute encephalopathy secondary to methadone overdose. Resolved. 3. Methadone overdose in the context of arrest to prevent being found in possession. No suicidal ideation. No co-ingestion at time. 4. Positive urine drug screen, polysubstance abuse. 5. Chronic neck pain, takes occasional Percocet as an outpatient.   Patient asymptomatic. Last dose of Narcan 1957 on 7/9. Discussed with Poison Control--as the patient is asymptomatic and has not required Narcan and 8 hours and ingestion was >24 hours ago no further recommendations. Patient clear for discharge.  Repeat EKG with normal QTC.  Plan discharge to police custody today.   Brendia Sacks, MD  [facility]  Pager [phone] If 7PM-7AM, please contact night-coverage at [email or web address], password [facility] 11/21/2013, 10:17 AM  LOS: 1 day   Consultants:    Procedures:    Antibiotics:    HPI/Subjective: Total 4.4 mg of Narcan given. Last dose of Narcan 1957 last evening. No concerns per nursing this morning.  Patient without complaints this morning except for mild headache. No nausea or vomiting. Admits to ingesting 9 tablets of methadone "I was being arrested, I did not want to be caught with them, I did not know it could hurt me". He had not been prescribed  these medications. No intent to harm himself no suicidal ideation.  Objective: Filed Vitals:   11/21/13 0645 11/21/13 0700 11/21/13 0800 11/21/13 0900  BP: 111/66 110/59 114/60 118/62  Pulse: 56 52    Temp:   98.4 F (36.9 C)   TempSrc:   Oral   Resp: 22 13 15 14   Height:      Weight:      SpO2: 96% 95%      Intake/Output Summary (Last 24 hours) at 11/21/13 1017 Last data filed at 11/21/13 0600  Gross per 24 hour  Intake 1016.67 ml  Output      0 ml  Net 1016.67 ml     Filed Weights   11/20/13 1329 11/21/13 0500  Weight: 90.719 kg (200 lb) 87.2 kg (192 lb 3.9 oz)    Exam:  Afebrile, vital signs stable.  Gen. Appears calm, comfortable.  Psych. Alert. Grossly normal mentation. Speech fluent and clear. Oriented to self, location, month, year.  Eyes. Appears grossly unremarkable  ENT. Appears grossly unremarkable  Cardiovascular. Regular rate and rhythm. No murmur, rub or gallop. No lower extremity edema. Telemetry sinus rhythm. No arrhythmias.  Respiratory. Clear to auscultation bilaterally. No wheezes, rales or rhonchi. Normal respiratory effort.  Musculoskeletal. Grossly normal tone and strength all joints.  Neurologic. Grossly nonfocal.   Data Reviewed:  Chemistry: Complete metabolic panel within normal limits.  Heme: CBC unremarkable  Other: Serum alcohol and admission was negative. Urine drug screen positive for cocaine, opiates tetrahydrocannabinol and benzodiazepines. EKG on admission sinus rhythm with no acute changes. Repeat EKG normal sinus rhythm, no acute changes seen, normal QTC.  Scheduled Meds: . heparin  5,000 Units Subcutaneous 3 times per day  .  sodium chloride  3 mL Intravenous Q12H   Continuous Infusions: . 0.9 % NaCl with KCl 20 mEq / L 100 mL/hr at 11/21/13 [id number]    Active Problems:   Opiate overdose   Intentional opiate overdose

## 2014-05-15 ENCOUNTER — Encounter (HOSPITAL_COMMUNITY): Payer: Self-pay | Admitting: *Deleted

## 2014-05-15 ENCOUNTER — Emergency Department (HOSPITAL_COMMUNITY)
Admission: EM | Admit: 2014-05-15 | Discharge: 2014-05-15 | Disposition: A | Discharge status: Home / Self Care | Payer: No Typology Code available for payment source | Attending: Emergency Medicine | Admitting: Emergency Medicine

## 2014-05-15 DIAGNOSIS — Z8719 Personal history of other diseases of the digestive system: Secondary | ICD-10-CM | POA: Insufficient documentation

## 2014-05-15 DIAGNOSIS — L989 Disorder of the skin and subcutaneous tissue, unspecified: Secondary | ICD-10-CM

## 2014-05-15 DIAGNOSIS — Z720 Tobacco use: Secondary | ICD-10-CM | POA: Insufficient documentation

## 2014-05-15 DIAGNOSIS — Z880 Allergy status to penicillin: Secondary | ICD-10-CM | POA: Insufficient documentation

## 2014-05-15 NOTE — ED Provider Notes (Signed)
CSN: [id number]     Arrival date & time 05/15/14  1805 History   First MD Initiated Contact with Patient 05/15/14 1902     Chief Complaint  Patient presents with  . Abscess     (Consider location/radiation/quality/duration/timing/severity/associated sxs/prior Treatment) HPI Comments: 34 year old male with history of polysubstance abuse presents with multiple small skin lesions. Patient's had intermittent skin lesions for the past 1-2 months. Patient had one facial abscess in the past unsure if it is MRSA. No fevers or chills. Patient has tried supportive care and soaks at home, right lower abdominal lesion has been draining well. No fevers or chills.  Patient is a 34 y.o. male presenting with abscess. The history is provided by the patient.  Abscess Associated symptoms: no fever, no headaches and no vomiting     Past Medical History  Diagnosis Date  . Back pain   . Headache(784.0)     occ headaches  . GERD (gastroesophageal reflux disease)   . Substance abuse    Past Surgical History  Procedure Laterality Date  . Knee arthroscopy    . Lumbar laminectomy/decompression microdiscectomy  06/05/2011    Procedure: LUMBAR LAMINECTOMY/DECOMPRESSION MICRODISCECTOMY;  Surgeon: Temple Pacini, MD;  Location: [facility];  Service: Neurosurgery;  Laterality: Right;  Right Lumbar five-Sacral one laminectomy/microdiscectomy  . Back surgery     Family History  Problem Relation Age of Onset  . Diabetes Mother   . Hypertension Mother   . Diabetes Father    History  Substance Use Topics  . Smoking status: Current Every Day Smoker -- 1.00 packs/day  . Smokeless tobacco: Not on file  . Alcohol Use: 0.0 oz/week     Comment: socially    Review of Systems  Constitutional: Negative for fever and chills.  Gastrointestinal: Negative for vomiting and abdominal pain.  Musculoskeletal: Negative for back pain, neck pain and neck stiffness.  Skin: Positive for wound. Negative for rash.   Neurological: Negative for light-headedness and headaches.      Allergies  Ms contin; Penicillins; Tramadol; Erythrocin; and Ketorolac tromethamine  Home Medications   Prior to Admission medications   Medication Sig Start Date End Date Taking? Authorizing Provider  ibuprofen (ADVIL,MOTRIN) 200 MG tablet Take 200 mg by mouth every 6 (six) hours as needed for headache or moderate pain.    Yes Historical Provider, MD   BP 135/93 mmHg  Pulse 90  Temp(Src) 98.6 F (37 C) (Oral)  Resp 20  Ht  (1.956 m)  Wt 197 lb (89.359 kg)  BMI 23.36 kg/m2  SpO2 98% Physical Exam  Constitutional: He is oriented to person, place, and time. He appears well-developed and well-nourished.  HENT:  Head: Normocephalic and atraumatic.  Eyes: Right eye exhibits no discharge. Left eye exhibits no discharge.  Neck: Normal range of motion. Neck supple. No tracheal deviation present.  Cardiovascular: Normal rate.   Abdominal: Soft.  Musculoskeletal: He exhibits tenderness. He exhibits no edema.  Neurological: He is alert and oriented to person, place, and time.  Skin: Skin is warm. No rash noted.  Patient has 1 cm skin lesion/folliculitis right lower pelvic region, mild crusted drainage, no surrounding induration or erythema. Patient has 1 cm tender region left axilla no surrounding erythema warmth or streaking erythema. Patient has a few other 0.5 cm superficial abrasion.  Psychiatric: He has a normal mood and affect.  Nursing note and vitals reviewed.   ED Course  Procedures (including critical care time) Labs Review Labs Reviewed - No  data to display  Imaging Review No results found.   EKG Interpretation None      MDM   Final diagnoses:  Skin lesions   Clinically concern for MRSA skin lesions, no significant abscess appreciated, discussed needle aspiration of left axilla, patient wishes to hold and follow-up closely supportive care. Discuss chlorhexidine wash, soaks and reasons to  return. Patient requesting further pain medicines, I reassured that ibuprofen and Tylenol were indicated at this time.  \ Results and differential diagnosis were discussed with the patient/parent/guardian. Close follow up outpatient was discussed, comfortable with the plan.   Medications - No data to display  Filed Vitals:   05/15/14 1815 05/15/14 1816  BP:  135/93  Pulse:  90  Temp:  98.6 F (37 C)  TempSrc:  Oral  Resp:  20  Height:  (1.956 m)   Weight: 197 lb (89.359 kg)   SpO2:  98%    Final diagnoses:  Skin lesions        Enid Skeens, MD 05/15/14 1935

## 2014-05-15 NOTE — Discharge Instructions (Signed)
If you were given medicines take as directed.  If you are on coumadin or contraceptives realize their levels and effectiveness is altered by many different medicines.  If you have any reaction (rash, tongues swelling, other) to the medicines stop taking and see a physician.   Please follow up as directed and return to the ER or see a physician for new or worsening symptoms.  Thank you. Filed Vitals:   05/15/14 1815 05/15/14 1816  BP:  135/93  Pulse:  90  Temp:  98.6 F (37 C)  TempSrc:  Oral  Resp:  20  Height:  (1.956 m)   Weight: 197 lb (89.359 kg)   SpO2:  98%

## 2014-05-15 NOTE — ED Notes (Signed)
Pt has abscess  LLQ  And rt axilla , and other sites of red areas present for 2 mos.

## 2014-10-13 ENCOUNTER — Encounter (HOSPITAL_COMMUNITY): Payer: Self-pay | Admitting: Emergency Medicine

## 2014-10-13 ENCOUNTER — Emergency Department (HOSPITAL_COMMUNITY): Payer: Self-pay

## 2014-10-13 ENCOUNTER — Emergency Department (HOSPITAL_COMMUNITY)
Admission: EM | Admit: 2014-10-13 | Discharge: 2014-10-13 | Disposition: A | Discharge status: Home / Self Care | Payer: Self-pay | Attending: Emergency Medicine | Admitting: Emergency Medicine

## 2014-10-13 DIAGNOSIS — X58XXXA Exposure to other specified factors, initial encounter: Secondary | ICD-10-CM | POA: Insufficient documentation

## 2014-10-13 DIAGNOSIS — Z880 Allergy status to penicillin: Secondary | ICD-10-CM | POA: Insufficient documentation

## 2014-10-13 DIAGNOSIS — Z8719 Personal history of other diseases of the digestive system: Secondary | ICD-10-CM | POA: Insufficient documentation

## 2014-10-13 DIAGNOSIS — M25579 Pain in unspecified ankle and joints of unspecified foot: Secondary | ICD-10-CM

## 2014-10-13 DIAGNOSIS — Y998 Other external cause status: Secondary | ICD-10-CM | POA: Insufficient documentation

## 2014-10-13 DIAGNOSIS — Y92310 Basketball court as the place of occurrence of the external cause: Secondary | ICD-10-CM | POA: Insufficient documentation

## 2014-10-13 DIAGNOSIS — S93401A Sprain of unspecified ligament of right ankle, initial encounter: Secondary | ICD-10-CM | POA: Insufficient documentation

## 2014-10-13 DIAGNOSIS — Z720 Tobacco use: Secondary | ICD-10-CM | POA: Insufficient documentation

## 2014-10-13 DIAGNOSIS — Y9367 Activity, basketball: Secondary | ICD-10-CM | POA: Insufficient documentation

## 2014-10-13 MED ORDER — IBUPROFEN 800 MG PO TABS: 800.0000 mg | ORAL_TABLET | Freq: Three times a day (TID) | ORAL | Status: DC

## 2014-10-13 NOTE — Discharge Instructions (Signed)

## 2014-10-13 NOTE — ED Provider Notes (Signed)
CSN: [id number]     Arrival date & time 10/13/14  1039 History   First MD Initiated Contact with Patient 10/13/14 1108     Chief Complaint  Patient presents with  . Ankle Pain     (Consider location/radiation/quality/duration/timing/severity/associated sxs/prior Treatment) HPI   Ryan Valentine is a 34 y.o. male who presents to the Emergency Department complaining of right ankle pain for 2 days after a eversion injury while playing basketball.  He reports pain with weight bearing and swelling to the outside of his ankle.  He has been applying ice and heat to his ankle with minimal relief.  He denies pain or swelling proximal to the ankle, weakness of the leg or numbness of the toes.     Past Medical History  Diagnosis Date  . Back pain   . Headache(784.0)     occ headaches  . GERD (gastroesophageal reflux disease)   . Substance abuse    Past Surgical History  Procedure Laterality Date  . Knee arthroscopy    . Lumbar laminectomy/decompression microdiscectomy  06/05/2011    Procedure: LUMBAR LAMINECTOMY/DECOMPRESSION MICRODISCECTOMY;  Surgeon: Temple PaciniHenry A Pool, MD;  Location: [facility];  Service: Neurosurgery;  Laterality: Right;  Right Lumbar five-Sacral one laminectomy/microdiscectomy  . Back surgery     Family History  Problem Relation Age of Onset  . Diabetes Mother   . Hypertension Mother   . Diabetes Father    History  Substance Use Topics  . Smoking status: Current Every Day Smoker -- 1.00 packs/day  . Smokeless tobacco: Not on file  . Alcohol Use: 0.0 oz/week     Comment: socially    Review of Systems  Constitutional: Negative for fever and chills.  Genitourinary: Negative for dysuria and difficulty urinating.  Musculoskeletal: Positive for joint swelling and arthralgias (right ankle pain and swelling).  Skin: Negative for color change and wound.  Neurological: Negative for weakness and numbness.  All other systems reviewed and are negative.     Allergies   Ms contin; Penicillins; Tramadol; Erythrocin; and Ketorolac tromethamine  Home Medications   Prior to Admission medications   Medication Sig Start Date End Date Taking? Authorizing Provider  ibuprofen (ADVIL,MOTRIN) 800 MG tablet Take 1 tablet (800 mg total) by mouth 3 (three) times daily. 10/13/14   Oda Placke, PA-C   BP 127/64 mmHg  Pulse 90  Temp(Src) 98.2 F (36.8 C)  Resp 18  Ht 6\' 4"  (1.93 m)  Wt 210 lb (95.255 kg)  BMI 25.57 kg/m2  SpO2 98% Physical Exam  Constitutional: He is oriented to person, place, and time. He appears well-developed and well-nourished. No distress.  HENT:  Head: Normocephalic and atraumatic.  Cardiovascular: Normal rate, regular rhythm, normal heart sounds and intact distal pulses.   No murmur heard. Pulmonary/Chest: Effort normal and breath sounds normal. No respiratory distress.  Musculoskeletal: He exhibits edema and tenderness.  ttp and STS of lateral ankle.  ROM is preserved.  DP pulse is brisk,distal sensation intact.  No erythema, abrasion, bruising or bony deformity.  No proximal tenderness.  Neurological: He is alert and oriented to person, place, and time. He exhibits normal muscle tone. Coordination normal.  Skin: Skin is warm and dry.  Nursing note and vitals reviewed.   ED Course  Procedures (including critical care time) Labs Review Labs Reviewed - No data to display  Imaging Review Dg Ankle Complete Right  10/13/2014   CLINICAL DATA:  Ankle pain and swelling following twisting injury 2 days ago.  Initial encounter.  EXAM: RIGHT ANKLE - COMPLETE 3+ VIEW  COMPARISON:  Radiographs 09/06/2009.  FINDINGS: The mineralization and alignment are normal. There is no evidence of acute fracture or dislocation. The joint spaces are maintained. There is moderate anterolateral soft tissue swelling without foreign body.  IMPRESSION: Anterolateral soft tissue swelling.  No acute osseous findings.   Electronically Signed   By: Carey Bullocks M.D.    On: 10/13/2014 11:33     EKG Interpretation None      MDM   Final diagnoses:  Ankle sprain, right, initial encounter    Pt has own crutches.    ASO applied.  Pain improved.  Remains NV intact.  Likely sprain.  Agrees to RICE therapy and close orthopedic f/u if needed.  Referral for orthopedics if needed.    Pauline Aus, PA-C 10/13/14 2134  Raeford Razor, MD 10/15/14 [phone]

## 2014-10-13 NOTE — ED Notes (Signed)
Pt c/o right ankle pain since turning it while playing basketball on Sunday. Swelling noted. Pt walking on crutches. nad noted.

## 2014-10-19 ENCOUNTER — Ambulatory Visit (INDEPENDENT_AMBULATORY_CARE_PROVIDER_SITE_OTHER): Payer: Self-pay | Admitting: Orthopedic Surgery

## 2014-10-19 ENCOUNTER — Encounter: Payer: Self-pay | Admitting: Orthopedic Surgery

## 2014-10-19 VITALS — BP 139/87 | Ht 76.0 in | Wt 210.0 lb

## 2014-10-19 DIAGNOSIS — S93401A Sprain of unspecified ligament of right ankle, initial encounter: Secondary | ICD-10-CM

## 2014-10-19 MED ORDER — IBUPROFEN 800 MG PO TABS: 800.0000 mg | ORAL_TABLET | Freq: Three times a day (TID) | ORAL | Status: DC

## 2014-10-19 MED ORDER — HYDROCODONE-ACETAMINOPHEN 5-325 MG PO TABS: 1.0000 | ORAL_TABLET | Freq: Four times a day (QID) | ORAL | Status: DC | PRN

## 2014-10-19 NOTE — Patient Instructions (Addendum)
Contrast baths  OOW X 2 WEEKS

## 2014-10-19 NOTE — Progress Notes (Signed)
Patient ID: Ryan MoMichael T Maziarz, male   DOB: 1980/10/03, 34 y.o.   MRN: [id number]  Patient ID: Ryan MoMichael T Cherney, male   DOB: 1980/10/03, 34 y.o.   MRN: [id number]  Chief Complaint  Patient presents with  . Follow-up    ER follow up on right ankle injury, DOI 10-11-14.    HPI Ryan MoMichael T Puckett is a 34 y.o. male.  The patient injured his right ankle playing basketball on the 29th. He came down on it wrong and felt a severe pain brought him to his knees. He limped out of the gym and eventually went to the ER for x-rays they were negative except for soft tissue swelling. He complains of persistent swelling loss of motion and painful weightbearing. Treatment to this point crutches, ibuprofen, ASO brace ice and heat  Review of Systems Review of Systems 1. He denies fever or chills 2. He denies numbness and tingling   Past Medical History  Diagnosis Date  . Back pain   . Headache(784.0)     occ headaches  . GERD (gastroesophageal reflux disease)   . Substance abuse     Past Surgical History  Procedure Laterality Date  . Knee arthroscopy    . Lumbar laminectomy/decompression microdiscectomy  06/05/2011    Procedure: LUMBAR LAMINECTOMY/DECOMPRESSION MICRODISCECTOMY;  Surgeon: Temple PaciniHenry A Pool, MD;  Location: [facility];  Service: Neurosurgery;  Laterality: Right;  Right Lumbar five-Sacral one laminectomy/microdiscectomy  . Back surgery      Social History History  Substance Use Topics  . Smoking status: Current Every Day Smoker -- 1.00 packs/day  . Smokeless tobacco: Not on file  . Alcohol Use: 0.0 oz/week     Comment: socially    Allergies  Allergen Reactions  . Ms Contin [Morphine Sulfate] Other (See Comments)    Makes my head feel funny   . Penicillins Other (See Comments)    From childhood  . Tramadol Other (See Comments)    Feels clammy  . Erythrocin Palpitations  . Ketorolac Tromethamine Anxiety    Current Outpatient Prescriptions  Medication Sig Dispense Refill  .  HYDROcodone-acetaminophen (NORCO) 5-325 MG per tablet Take 1 tablet by mouth every 6 (six) hours as needed for moderate pain. 30 tablet 0  . ibuprofen (ADVIL,MOTRIN) 800 MG tablet Take 1 tablet (800 mg total) by mouth 3 (three) times daily. 90 tablet 0   No current facility-administered medications for this visit.      Physical Exam Physical Exam Blood pressure 139/87, height 6\' 4"  (1.93 m), weight 210 lb (95.255 kg).  Gen. appearance normal athletic build mesomorphic The patient is alert and oriented person place and time Mood is normal affect is normal Ambulatory status crutches nonweightbearing  Exam of the right ankle  Inspection we see that he has tenderness and swelling over the lateral joint line and ATFL with no pain or tenderness medially. His range of motion is very limited by pain he can barely bring the foot to neutral passively I can bring it to 10 dorsiflexion. It was difficult to test stability at this point although was attempted it was painful. Muscle tone is normal no atrophy skin is intact pulses are good and sensation is normal.  Data Reviewed Imaging from the hospital I interpret as normal ankle with soft tissue swelling. Report was reviewed and indicates the same  Assessment    Encounter Diagnosis  Name Primary?  Marland Kitchen. Ankle sprain, right, initial encounter Yes        Plan  Although he is uninsured he agreed to pay for a Cam Walker I told minutes up to $300. He was placed in a Cam Walker weightbearing as tolerated crutches as needed wean over 2-3 days.*Contrast baths. Continue ibuprofen take Norco 5 mg as well for pain  Return 2 weeks. No work for 2 weeks.       Fuller Canada 10/19/2014, 10:47 AM

## 2014-11-02 ENCOUNTER — Encounter: Payer: Self-pay | Admitting: Orthopedic Surgery

## 2014-11-02 ENCOUNTER — Ambulatory Visit: Payer: Self-pay | Admitting: Orthopedic Surgery

## 2015-09-14 ENCOUNTER — Emergency Department (HOSPITAL_COMMUNITY): Payer: Self-pay

## 2015-09-14 ENCOUNTER — Encounter (HOSPITAL_COMMUNITY): Payer: Self-pay

## 2015-09-14 ENCOUNTER — Emergency Department (HOSPITAL_COMMUNITY)
Admission: EM | Admit: 2015-09-14 | Discharge: 2015-09-14 | Disposition: A | Discharge status: Home / Self Care | Payer: Self-pay | Attending: Emergency Medicine | Admitting: Emergency Medicine

## 2015-09-14 DIAGNOSIS — F17200 Nicotine dependence, unspecified, uncomplicated: Secondary | ICD-10-CM | POA: Insufficient documentation

## 2015-09-14 DIAGNOSIS — M722 Plantar fascial fibromatosis: Secondary | ICD-10-CM | POA: Insufficient documentation

## 2015-09-14 DIAGNOSIS — Z791 Long term (current) use of non-steroidal anti-inflammatories (NSAID): Secondary | ICD-10-CM | POA: Insufficient documentation

## 2015-09-14 LAB — CBG MONITORING, ED: GLUCOSE-CAPILLARY: 79 mg/dL (ref 65–99)

## 2015-09-14 MED ORDER — PREDNISONE 20 MG PO TABS: 40.0000 mg | ORAL_TABLET | Freq: Every day | ORAL | Status: DC

## 2015-09-14 NOTE — ED Provider Notes (Signed)
CSN: [id number]     Arrival date & time 09/14/15  0946 History   First MD Initiated Contact with Patient 09/14/15 1048     Chief Complaint  Patient presents with  . Foot Pain     (Consider location/radiation/quality/duration/timing/severity/associated sxs/prior Treatment) HPI   Ryan MoMichael T Valentine is a 35 y.o. male who presents to the Emergency Department complaining of left foot pain for 2 weeks.  He describes a constant, burning sensation to the plantar foot along the "ball" of his foot.  Pain is worsened with standing and walking.  He denies known injury, redness, swelling, or open wound. Denies pain or swelling proximal to the foot.   Past Medical History  Diagnosis Date  . Back pain   . Headache(784.0)     occ headaches  . GERD (gastroesophageal reflux disease)   . Substance abuse    Past Surgical History  Procedure Laterality Date  . Knee arthroscopy    . Lumbar laminectomy/decompression microdiscectomy  06/05/2011    Procedure: LUMBAR LAMINECTOMY/DECOMPRESSION MICRODISCECTOMY;  Surgeon: Temple PaciniHenry A Pool, MD;  Location: [facility];  Service: Neurosurgery;  Laterality: Right;  Right Lumbar five-Sacral one laminectomy/microdiscectomy  . Back surgery     Family History  Problem Relation Age of Onset  . Diabetes Mother   . Hypertension Mother   . Diabetes Father    Social History  Substance Use Topics  . Smoking status: Current Every Day Smoker -- 1.00 packs/day  . Smokeless tobacco: None  . Alcohol Use: 0.0 oz/week     Comment: socially    Review of Systems  Constitutional: Negative for fever and chills.  Genitourinary: Negative for dysuria and difficulty urinating.  Musculoskeletal: Positive for arthralgias (left foot pain). Negative for joint swelling.  Skin: Negative for color change, pallor, rash and wound.  Neurological: Negative for weakness and numbness.  All other systems reviewed and are negative.     Allergies  Ms contin; Penicillins; Tramadol;  Erythrocin; and Ketorolac tromethamine  Home Medications   Prior to Admission medications   Medication Sig Start Date End Date Taking? Authorizing Provider  ibuprofen (ADVIL,MOTRIN) 200 MG tablet Take 800 mg by mouth every 6 (six) hours as needed for moderate pain.   Yes Historical Provider, MD   BP 134/82 mmHg  Pulse 79  Temp(Src) 98.7 F (37.1 C) (Oral)  Resp 18  Ht 6\' 5"  (1.956 m)  Wt 90.719 kg  BMI 23.71 kg/m2  SpO2 94% Physical Exam  Constitutional: He is oriented to person, place, and time. He appears well-developed and well-nourished. No distress.  HENT:  Head: Normocephalic and atraumatic.  Cardiovascular: Normal rate, regular rhythm, normal heart sounds and intact distal pulses.   Pulmonary/Chest: Effort normal and breath sounds normal.  Musculoskeletal: He exhibits tenderness.  Localized tenderness of the plantar surface of the distal left foot.  ROM of the toes is preserved.  DP pulse is brisk,distal sensation intact.  No edema, erythema,  bruising or bony deformity.  No proximal tenderness.  Neurological: He is alert and oriented to person, place, and time. He exhibits normal muscle tone. Coordination normal.  Skin: Skin is warm and dry.  Nursing note and vitals reviewed.   ED Course  Procedures (including critical care time) Labs Review Labs Reviewed - No data to display  Imaging Review Dg Foot Complete Left  09/14/2015  CLINICAL DATA:  Patient complaining of burning type pain to the plantar surface of the left foot for the past 2 days. No known injury. EXAM:  LEFT FOOT - COMPLETE 3+ VIEW COMPARISON:  None. FINDINGS: There is no evidence of fracture or dislocation. There is no evidence of arthropathy or other focal bone abnormality. Soft tissues are unremarkable. IMPRESSION: Negative. Electronically Signed   By: Amie Portland M.D.   On: 09/14/2015 11:28   I have personally reviewed and evaluated these images and lab results as part of my medical decision-making.    EKG Interpretation None      MDM   Final diagnoses:  Plantar fasciitis of left foot    Localized tenderness to the distal left foot.  No edema, no signs of infection.  NV intact.  Symptoms likely related to plantar fasciitis. Patient appears stable for discharge, prescription for prednisone. Referral information given for podiatry.    Pauline Aus, PA-C 09/16/15 1610  Donnetta Hutching, MD 09/16/15 [phone]

## 2015-09-14 NOTE — Discharge Instructions (Signed)
Plantar Fasciitis Plantar fasciitis is a painful foot condition that affects the heel. It occurs when the band of tissue that connects the toes to the heel bone (plantar fascia) becomes irritated. This can happen after exercising too much or doing other repetitive activities (overuse injury). The pain from plantar fasciitis can range from mild irritation to severe pain that makes it difficult for you to walk or move. The pain is usually worse in the morning or after you have been sitting or lying down for a while. CAUSES This condition may be caused by:  Standing for long periods of time.  Wearing shoes that do not fit.  Doing high-impact activities, including running, aerobics, and ballet.  Being overweight.  Having an abnormal way of walking (gait).  Having tight calf muscles.  Having high arches in your feet.  Starting a new athletic activity. SYMPTOMS The main symptom of this condition is heel pain. Other symptoms include:  Pain that gets worse after activity or exercise.  Pain that is worse in the morning or after resting.  Pain that goes away after you walk for a few minutes. DIAGNOSIS This condition may be diagnosed based on your signs and symptoms. Your health care provider will also do a physical exam to check for:  A tender area on the bottom of your foot.  A high arch in your foot.  Pain when you move your foot.  Difficulty moving your foot. You may also need to have imaging studies to confirm the diagnosis. These can include:  X-rays.  Ultrasound.  MRI. TREATMENT  Treatment for plantar fasciitis depends on the severity of the condition. Your treatment may include:  Rest, ice, and over-the-counter pain medicines to manage your pain.  Exercises to stretch your calves and your plantar fascia.  A splint that holds your foot in a stretched, upward position while you sleep (night splint).  Physical therapy to relieve symptoms and prevent problems in the  future.  Cortisone injections to relieve severe pain.  Extracorporeal shock wave therapy (ESWT) to stimulate damaged plantar fascia with electrical impulses. It is often used as a last resort before surgery.  Surgery, if other treatments have not worked after 12 months. HOME CARE INSTRUCTIONS  Take medicines only as directed by your health care provider.  Avoid activities that cause pain.  Roll the bottom of your foot over a bag of ice or a bottle of cold water. Do this for 20 minutes, 3-4 times a day.  Perform simple stretches as directed by your health care provider.  Try wearing athletic shoes with air-sole or gel-sole cushions or soft shoe inserts.  Wear a night splint while sleeping, if directed by your health care provider.  Keep all follow-up appointments with your health care provider. PREVENTION   Do not perform exercises or activities that cause heel pain.  Consider finding low-impact activities if you continue to have problems.  Lose weight if you need to. The best way to prevent plantar fasciitis is to avoid the activities that aggravate your plantar fascia. SEEK MEDICAL CARE IF:  Your symptoms do not go away after treatment with home care measures.  Your pain gets worse.  Your pain affects your ability to move or do your daily activities.   This information is not intended to replace advice given to you by your health care provider. Make sure you discuss any questions you have with your health care provider.   Document Released: 01/24/2001 Document Revised: 01/20/2015 Document Reviewed: 03/11/2014 Elsevier   Interactive Patient Education 2016 [employer]  

## 2015-09-14 NOTE — ED Notes (Signed)
Pt c/o burning in bottom of left foot for the past 2 weeks.

## 2015-09-26 ENCOUNTER — Encounter (HOSPITAL_COMMUNITY): Payer: Self-pay | Admitting: Emergency Medicine

## 2015-09-26 ENCOUNTER — Emergency Department (HOSPITAL_COMMUNITY)
Admission: EM | Admit: 2015-09-26 | Discharge: 2015-09-26 | Disposition: A | Discharge status: Home / Self Care | Payer: MEDICAID | Attending: Emergency Medicine | Admitting: Emergency Medicine

## 2015-09-26 DIAGNOSIS — Z791 Long term (current) use of non-steroidal anti-inflammatories (NSAID): Secondary | ICD-10-CM | POA: Insufficient documentation

## 2015-09-26 DIAGNOSIS — M79672 Pain in left foot: Secondary | ICD-10-CM

## 2015-09-26 DIAGNOSIS — L859 Epidermal thickening, unspecified: Secondary | ICD-10-CM | POA: Insufficient documentation

## 2015-09-26 MED ORDER — DEXAMETHASONE 4 MG PO TABS: 4.0000 mg | ORAL_TABLET | Freq: Two times a day (BID) | ORAL | Status: DC

## 2015-09-26 NOTE — Discharge Instructions (Signed)
Please see Dr. Stacie AcresMayer, or member of his team at the [facility]. Please soak her feet in warm Epsom salt water for 10 minutes daily, apply Vaseline and then put on socks before bedtime. Please alternate your shoes. Use Decadron 2 times daily with food. Please use 600 mg of ibuprofen with each meal and at bedtime to decrease inflammation and swelling at the site of the calluses.

## 2015-09-26 NOTE — ED Notes (Signed)
Left lateral foot pain x 3 weeks. Seen here over a week ago but pain continues to worsen. Difficulty bearing weight on left foot.  Called the referral doc but not accepting new patients.

## 2015-09-26 NOTE — ED Provider Notes (Signed)
CSN: [id number]     Arrival date & time 09/26/15  1344 History  By signing my name below, I, Tanda Rockers, attest that this documentation has been prepared under the direction and in the presence of Ivery Quale, PA-C.  Electronically Signed: Tanda Rockers, ED Scribe. 09/26/2015. 2:53 PM.   Chief Complaint  Patient presents with  . Foot Pain    The history is provided by the patient. No language interpreter was used.    HPI Comments: JAVARES KAUFHOLD is a 35 y.o. male who presents to the Emergency Department complaining of gradual onset, constant, stabbing, left plantar foot pain x 3 weeks. No known injury to the foot. Pt states that he was seen at the ED a couple of weeks ago and was referred to podiatry. When he called the podiatrist they were not accepting new patients or self pay, prompting him to return to the ED. Pt completed his course of Prednisone and has been taking Ibuprofen without relief. He has been rolling a frozen water bottle over bottom of foot without relief. The pain is exacerbated with bearing weight onto the foot. Denies weakness, numbness, tingling, or any other associated symptoms.   Per triage report: Pt was seen in the ED on 09/14/2015 (approximately 2 weeks ago) for the same pain. He had an x ray done at that time with no acute findings. He was diagnosed with plantar fasciitis, prescribed Prednisone, and given a referral to podiatry.   Past Medical History  Diagnosis Date  . Back pain   . Headache(784.0)     occ headaches  . GERD (gastroesophageal reflux disease)   . Substance abuse    Past Surgical History  Procedure Laterality Date  . Knee arthroscopy    . Lumbar laminectomy/decompression microdiscectomy  06/05/2011    Procedure: LUMBAR LAMINECTOMY/DECOMPRESSION MICRODISCECTOMY;  Surgeon: Temple Pacini, MD;  Location: [facility];  Service: Neurosurgery;  Laterality: Right;  Right Lumbar five-Sacral one laminectomy/microdiscectomy  . Back surgery      Family History  Problem Relation Age of Onset  . Diabetes Mother   . Hypertension Mother   . Diabetes Father    Social History  Substance Use Topics  . Smoking status: Current Every Day Smoker -- 1.00 packs/day  . Smokeless tobacco: None  . Alcohol Use: 0.0 oz/week     Comment: socially    Review of Systems  Musculoskeletal: Positive for arthralgias (left foot).  Neurological: Negative for weakness and numbness.  All other systems reviewed and are negative.  Allergies  Ms contin; Penicillins; Tramadol; Erythrocin; and Ketorolac tromethamine  Home Medications   Prior to Admission medications   Medication Sig Start Date End Date Taking? Authorizing Provider  ibuprofen (ADVIL,MOTRIN) 200 MG tablet Take 800 mg by mouth every 6 (six) hours as needed for moderate pain.   Yes Historical Provider, MD  predniSONE (DELTASONE) 20 MG tablet Take 2 tablets (40 mg total) by mouth daily. For 5 days Patient not taking: Reported on 09/26/2015 09/14/15   Tammy Triplett, PA-C   BP 134/74 mmHg  Pulse 74  Resp 18  Wt 200 lb (90.719 kg)  SpO2 99%   Physical Exam  Constitutional: He is oriented to person, place, and time. He appears well-developed and well-nourished. No distress.  HENT:  Head: Normocephalic and atraumatic.  Eyes: Conjunctivae and EOM are normal.  Neck: Neck supple. No tracheal deviation present.  Cardiovascular: Normal rate.   Pulmonary/Chest: Effort normal. No respiratory distress.  Musculoskeletal: Normal range of motion.  Left foot: There is tenderness.  DP and PT pulses on left are 2+.  The capillary refill is less than 2 seconds.  No hot areas appreciated to left foot.  There is a callus at the lateral and plantar surface of the 5th MP joint.  Callus of the 1st-4th MP joints; Not as prominent as the 5th.  There is no abscess and no puncture wound of the plantar surface.  The achilles tendon is intact.   Neurological: He is alert and oriented to person, place,  and time.  Skin: Skin is warm and dry.  Psychiatric: He has a normal mood and affect. His behavior is normal.  Nursing note and vitals reviewed.   ED Course  Procedures (including critical care time)  DIAGNOSTIC STUDIES: Oxygen Saturation is 99% on RA, normal by my interpretation.    COORDINATION OF CARE: 2:52 PM-Discussed treatment plan which includes referral to podiatrist, Dr. Charlsie Merlesegal or member of his staff with pt at bedside and pt agreed to plan. Advised pt to continue anti-inflammatories and steroid medication.    MDM  Vital signs reviewed. The examination does not suggest major infection at this time. I discussed with the patient that because of the amount of callus formation, as well as the position of the callus formation and the amount of pain that he is having, that he should be seen by a podiatrist. The patient [facility] [place] on. The patient acknowledges understanding of the discharge instructions.    Final diagnoses:  Foot pain, left  Hyperkeratosis of sole    **I personally performed the services described in this documentation, which was scribed in my presence. The recorded information has been reviewed and is accurate.*I have reviewed nursing notes, vital signs, and all appropriate lab and imaging results for this patient.     Ivery QualeHobson Danielys Madry, PA-C 09/27/15 1957  Donnetta HutchingBrian Cook, MD 09/29/15 [phone]

## 2016-08-20 ENCOUNTER — Encounter (HOSPITAL_COMMUNITY): Payer: Self-pay | Admitting: Emergency Medicine

## 2016-08-20 ENCOUNTER — Emergency Department (HOSPITAL_COMMUNITY)
Admission: EM | Admit: 2016-08-20 | Discharge: 2016-08-20 | Disposition: A | Discharge status: Home / Self Care | Payer: BLUE CROSS/BLUE SHIELD | Attending: Emergency Medicine | Admitting: Emergency Medicine

## 2016-08-20 DIAGNOSIS — F17210 Nicotine dependence, cigarettes, uncomplicated: Secondary | ICD-10-CM | POA: Insufficient documentation

## 2016-08-20 DIAGNOSIS — M546 Pain in thoracic spine: Secondary | ICD-10-CM | POA: Diagnosis present

## 2016-08-20 MED ORDER — ACETAMINOPHEN 500 MG PO TABS
1000.0000 mg | ORAL_TABLET | Freq: Once | ORAL | Status: AC
  Administered 2016-08-20: 1000 mg via ORAL
  Filled 2016-08-20: qty 2

## 2016-08-20 MED ORDER — METHOCARBAMOL 500 MG PO TABS: 500.0000 mg | ORAL_TABLET | Freq: Four times a day (QID) | ORAL | 0 refills | Status: AC | PRN

## 2016-08-20 NOTE — ED Triage Notes (Signed)
Patient c/o lower back pain that started on 08/13/16 while helping with easter egg hunt. Per patient bent over to pick up egg and felt catch in his back. Patient reports using advil and goody powder with no relief. Denies any complication with BM or urination.

## 2016-08-20 NOTE — Discharge Instructions (Signed)
We suspect that your back pain is from a muscular injury or spasm. Given the point tenderness along your thoracic spine we are also concerned for a possible disc herniation, inflammation or nerve involvement. Less likely an infection in your spinal canal.  As we discussed, we are recommending an outpatient MRI to rule out these conditions. Until then we will prescribe you a muscle relaxer and anti-inflammatory medications for your pain. Please present to MRI tomorrow. Return to the emergency Department if he notices fever, night sweats, chills, groin numbness, lower extremity numbness or weakness.  For pain please take ibuprofen 600 mg + tylenol 1000 mg every 8 hours.  In addition, take muscle relaxer (robaxin) four times a day.

## 2016-08-20 NOTE — ED Provider Notes (Signed)
[facility]PT Provider Note   CSN: [id number] Arrival date & time: 08/20/16  1405  By signing my name below, I, Ryan Valentine, attest that this documentation has been prepared under the direction and in the presence of Sharen Heck, PA-C.  Electronically Signed: Diona Valentine, ED Scribe. 08/20/16. 2:33 PM.   History   Chief Complaint Chief Complaint  Patient presents with  . Back Pain    HPI Ryan Valentine is a 36 y.o. male who presents to the Emergency Department complaining of a sudden, sharp right thoracic back pain that started on 08/13/16. Pt notes he bent over and felt a sharp, shooting, and burning pain in his back. He notes he was unable to stand back up and straighten his back without help. Pt states pain feels like "fireworks" and radiates into his right leg causing mild discomfort. Pt has tried taking Advil and goody power with mild relief. He also notes getting a massage which temporarily helped. Deep breathing, cough, laughing, direct palpation and movement exacerbates his pain. H/o disc surgery ~ five years ago, however, sx do not feel the same as onset. No h/o of CA. No recent IV drug abuse (last used two years ago). No falls, MVCs, trauma or recent injuries. Pt denies fever, chills, sweats, abdominal pain, nausea, vomiting, dysuria, difficulty urinating, b/b incontinence or retention, and groin numbness.   The history is provided by the patient. No language interpreter was used.    Past Medical History:  Diagnosis Date  . Back pain   . GERD (gastroesophageal reflux disease)   . Headache(784.0)    occ headaches  . Substance abuse     Patient Active Problem List   Diagnosis Date Noted  . Polysubstance abuse 11/21/2013  . Acute encephalopathy 11/21/2013  . Acute respiratory failure (HCC) 11/21/2013  . Opiate overdose 11/20/2013  . Intentional opiate overdose (HCC) 11/20/2013  . Facial cellulitis 02/12/2012  . Tobacco abuse 02/12/2012  . Lumbar  herniated disc 06/05/2011    Past Surgical History:  Procedure Laterality Date  . BACK SURGERY    . KNEE ARTHROSCOPY    . LUMBAR LAMINECTOMY/DECOMPRESSION MICRODISCECTOMY  06/05/2011   Procedure: LUMBAR LAMINECTOMY/DECOMPRESSION MICRODISCECTOMY;  Surgeon: Temple Pacini, MD;  Location: [facility];  Service: Neurosurgery;  Laterality: Right;  Right Lumbar five-Sacral one laminectomy/microdiscectomy       Home Medications    Prior to Admission medications   Medication Sig Start Date End Date Taking? Authorizing Provider  dexamethasone (DECADRON) 4 MG tablet Take 1 tablet (4 mg total) by mouth 2 (two) times daily with a meal. 09/26/15   Ivery Quale, PA-C  ibuprofen (ADVIL,MOTRIN) 200 MG tablet Take 800 mg by mouth every 6 (six) hours as needed for moderate pain.    Historical Provider, MD  methocarbamol (ROBAXIN) 500 MG tablet Take 1 tablet (500 mg total) by mouth every 6 (six) hours as needed for muscle spasms. 08/20/16 08/27/16  Liberty Handy, PA-C  predniSONE (DELTASONE) 20 MG tablet Take 2 tablets (40 mg total) by mouth daily. For 5 days Patient not taking: Reported on 09/26/2015 09/14/15   Pauline Aus, PA-C    Family History Family History  Problem Relation Age of Onset  . Diabetes Mother   . Hypertension Mother   . Diabetes Father     Social History Social History  Substance Use Topics  . Smoking status: Current Every Day Smoker    Packs/day: 1.00    Types: Cigarettes  . Smokeless tobacco: Never Used  .  Alcohol use 0.0 oz/week     Comment: socially     Allergies   Ms contin [morphine sulfate]; Penicillins; Tramadol; Erythrocin; and Ketorolac tromethamine   Review of Systems Review of Systems  Constitutional: Negative for chills and fever.  Gastrointestinal: Negative for abdominal pain, nausea and vomiting.  Genitourinary: Negative for difficulty urinating and dysuria.  Musculoskeletal: Positive for arthralgias (right leg) and back pain.  Neurological:  Negative for numbness.     Physical Exam Updated Vital Signs BP (!) 154/90 (BP Location: Left Arm)   Pulse 87   Temp 97.5 F (36.4 C) (Oral)   Resp 18   Ht  (1.956 m)   Wt 90.7 kg   SpO2 100%   BMI 23.72 kg/m   Physical Exam  Constitutional: He is oriented to person, place, and time. He appears well-developed and well-nourished. No distress.  HENT:  Head: Normocephalic and atraumatic.  Neck: Neck supple.  Cardiovascular: Normal rate, regular rhythm and normal heart sounds.   No murmur heard. Pulmonary/Chest: Effort normal and breath sounds normal. No respiratory distress. He has no wheezes. He has no rales.  Musculoskeletal: Normal range of motion. He exhibits tenderness.  Midline thoracic tenderness. Bilateral thoracic paraspinal muscular tenderness. Right sided joint tenderness. No R sided sciatic notch tenderness. Antalgic gait favoring R side Full active ROM of CTL spine including flexion, extension, lateral bend and rotation with pain reported with back extension bilateral bend No midline CL spine tenderness. No CL paraspinal tenderness or increased tone.  L SI joint and sciatic notch non tender.  Full passive hip, knee and ankle ROM bilaterally.  Negative SLR. Negative Faber. Negative Stinchfield test.   Neurological: He is alert and oriented to person, place, and time.  Gait normal. No foot drop.  5/5 strength with hip flexion and extension, bilaterally.  5/5 strength with knee flexion and extension, bilaterally.  5/5 strength with ankle dorsiflexion and plantar flexion, bilaterally.  Sensation to light touch intact in the distribution of the obturator nerve, lateral cutaneous nerve, femoral nerve, common fibular nerve.  2/4 knee and ankle DTR bilaterally.    Foot: sensation to light touch intact in the distribution of the saphenous nerve, medial plantar nerve, lateral plantar nerve, bilaterally.  Skin: Skin is warm and dry.  Nursing note and vitals  reviewed.    ED Treatments / Results  DIAGNOSTIC STUDIES: Oxygen Saturation is 100% on RA, normal by my interpretation.   COORDINATION OF CARE: 2:27 PM-Discussed next steps with pt. Pt verbalized understanding and is agreeable with the plan.    Labs (all labs ordered are listed, but only abnormal results are displayed) Labs Reviewed - No data to display  EKG  EKG Interpretation None       Radiology No results found.  Procedures Procedures (including critical care time)  Medications Ordered in ED Medications  acetaminophen (TYLENOL) tablet 1,000 mg (1,000 mg Oral Given 08/20/16 1507)     Initial Impression / Assessment and Plan / ED Course  I have reviewed the triage vital signs and the nursing notes.  Pertinent labs & imaging results that were available during my care of the patient were reviewed by me and considered in my medical decision making (see chart for details).    Patient is a 36 y.o. male with a remote h/o IVDU (sober 2 years) who presents to the ED with thoracic back pain described as sharp, shooting, intermittent that radiates occasionally to the right's lower extremity that started 1 week ago as  he was picking something up.   On exam pt has VS wnl, abdominal exam negative without guarding, rebound or rigidity.  Negative Murphy's and McBurney's.  No suprapubic or CVAT.  Musculoskeletal exam revealed midline thoracic spinous process tenderness, right-sided thoracic paraspinal muscular tenderness, . right SI joint tenderness and pain with back extension. No neurological deficits appreciated. Patient is ambulatory with antalgic gait. Initial ddx include strain, spasm and less likely ruptured disc, UTI/pyelo, PID, kidney stone, cauda equina or epidural abscess.  No red flag symptoms of back pain including: fecal incontinence, urinary retention or overflow incontinence, night sweats, waking from sleep with back pain, unexplained fevers or weight loss, h/o cancer,  recent trauma. Low suspicion for cauda equina.  Although patient denied recent h/o of IVDU he has a h/o of IV drug use I am considering epidural abscess or inflammation. Also considering disc injury and radiculopathy. I do not think an x-ray or CT scan is appropriate today as I'm not concerned for a bony injury, compression fracture or dislocation given no history of trauma.  I will recommend an outpatient MRI within the next 2-3 days. Will recommend Conservative measures such as ice/heat, mild stretches, muscle relaxant and ibuprofen indicated with orthopedic f/u.  Patient has contact information for MRI and orthopedic follow-up as well.ED return precautions discussed with pt who verbalized understanding and was agreeable to dispo plan.  Patient is aware of red flag symptoms to monitor for that would warrant return to the emergency department for reevaluation.  Patient, ED treatment and discharge plan was discussed with supervising physician who is agreeable with plan.   Final Clinical Impressions(s) / ED Diagnoses   Final diagnoses:  Thoracic spine pain    New Prescriptions Discharge Medication List as of 08/20/2016  3:08 PM    START taking these medications   Details  methocarbamol (ROBAXIN) 500 MG tablet Take 1 tablet (500 mg total) by mouth every 6 (six) hours as needed for muscle spasms., Starting Sun 08/20/2016, Until Sun 08/27/2016, Print       I personally performed the services described in this documentation, which was scribed in my presence. The recorded information has been reviewed and is accurate.    Liberty Handy, PA-C 08/20/16 1545    Eber Hong, MD 08/20/16 [phone]

## 2016-08-20 NOTE — ED Notes (Signed)
Pt understands to call tomorrow to schedule a time for MRI due to pt works nights.  Pt verbalized understanding. Number provided for pt to call.

## 2016-08-22 ENCOUNTER — Other Ambulatory Visit (HOSPITAL_COMMUNITY): Payer: BLUE CROSS/BLUE SHIELD

## 2016-09-12 ENCOUNTER — Other Ambulatory Visit (HOSPITAL_COMMUNITY): Payer: Self-pay | Admitting: Family Medicine

## 2016-09-12 DIAGNOSIS — M545 Low back pain: Secondary | ICD-10-CM

## 2016-09-20 ENCOUNTER — Ambulatory Visit (HOSPITAL_COMMUNITY): Admission: RE | Admit: 2016-09-20 | Payer: BLUE CROSS/BLUE SHIELD | Source: Ambulatory Visit

## 2016-10-03 ENCOUNTER — Ambulatory Visit (HOSPITAL_COMMUNITY): Payer: Self-pay

## 2016-10-16 ENCOUNTER — Other Ambulatory Visit (HOSPITAL_COMMUNITY): Payer: Self-pay | Admitting: Emergency Medicine

## 2016-10-20 ENCOUNTER — Ambulatory Visit (HOSPITAL_COMMUNITY)
Admission: RE | Admit: 2016-10-20 | Discharge: 2016-10-20 | Disposition: A | Discharge status: Home / Self Care | Payer: Self-pay | Source: Ambulatory Visit | Attending: Emergency Medicine | Admitting: Emergency Medicine

## 2016-10-20 ENCOUNTER — Other Ambulatory Visit (HOSPITAL_COMMUNITY): Payer: BLUE CROSS/BLUE SHIELD

## 2016-10-20 ENCOUNTER — Ambulatory Visit (HOSPITAL_COMMUNITY)
Admission: RE | Admit: 2016-10-20 | Discharge: 2016-10-20 | Disposition: A | Discharge status: Home / Self Care | Payer: Self-pay | Source: Ambulatory Visit | Attending: Family Medicine | Admitting: Family Medicine

## 2016-10-20 ENCOUNTER — Ambulatory Visit (HOSPITAL_COMMUNITY): Payer: MEDICAID

## 2016-10-20 DIAGNOSIS — M545 Low back pain: Secondary | ICD-10-CM

## 2016-10-30 ENCOUNTER — Ambulatory Visit (HOSPITAL_COMMUNITY)
Admission: RE | Admit: 2016-10-30 | Discharge: 2016-10-30 | Disposition: A | Discharge status: Home / Self Care | Payer: Self-pay | Source: Ambulatory Visit | Attending: Emergency Medicine | Admitting: Emergency Medicine

## 2016-10-30 ENCOUNTER — Ambulatory Visit (HOSPITAL_COMMUNITY)
Admission: RE | Admit: 2016-10-30 | Discharge: 2016-10-30 | Disposition: A | Discharge status: Home / Self Care | Payer: BLUE CROSS/BLUE SHIELD | Source: Ambulatory Visit | Attending: Family Medicine | Admitting: Family Medicine

## 2016-10-30 DIAGNOSIS — M48061 Spinal stenosis, lumbar region without neurogenic claudication: Secondary | ICD-10-CM | POA: Insufficient documentation

## 2016-10-30 DIAGNOSIS — M5126 Other intervertebral disc displacement, lumbar region: Secondary | ICD-10-CM | POA: Insufficient documentation

## 2016-10-30 DIAGNOSIS — M47816 Spondylosis without myelopathy or radiculopathy, lumbar region: Secondary | ICD-10-CM | POA: Insufficient documentation

## 2017-01-08 ENCOUNTER — Encounter (HOSPITAL_COMMUNITY): Payer: Self-pay | Admitting: *Deleted

## 2017-01-08 ENCOUNTER — Emergency Department (HOSPITAL_COMMUNITY)
Admission: EM | Admit: 2017-01-08 | Discharge: 2017-01-08 | Disposition: A | Discharge status: Home / Self Care | Payer: Self-pay | Attending: Emergency Medicine | Admitting: Emergency Medicine

## 2017-01-08 ENCOUNTER — Emergency Department (HOSPITAL_COMMUNITY): Payer: Self-pay

## 2017-01-08 DIAGNOSIS — R110 Nausea: Secondary | ICD-10-CM | POA: Insufficient documentation

## 2017-01-08 DIAGNOSIS — F419 Anxiety disorder, unspecified: Secondary | ICD-10-CM | POA: Insufficient documentation

## 2017-01-08 DIAGNOSIS — R1013 Epigastric pain: Secondary | ICD-10-CM | POA: Insufficient documentation

## 2017-01-08 DIAGNOSIS — Z79899 Other long term (current) drug therapy: Secondary | ICD-10-CM | POA: Insufficient documentation

## 2017-01-08 DIAGNOSIS — F17210 Nicotine dependence, cigarettes, uncomplicated: Secondary | ICD-10-CM | POA: Insufficient documentation

## 2017-01-08 LAB — CBC WITH DIFFERENTIAL/PLATELET
BASOS ABS: 0 10*3/uL (ref 0.0–0.1)
BASOS PCT: 0 %
EOS PCT: 0 %
Eosinophils Absolute: 0 10*3/uL (ref 0.0–0.7)
HCT: 45.2 % (ref 39.0–52.0)
HEMOGLOBIN: 15.8 g/dL (ref 13.0–17.0)
Lymphocytes Relative: 26 %
Lymphs Abs: 2.3 10*3/uL (ref 0.7–4.0)
MCH: 31.8 pg (ref 26.0–34.0)
MCHC: 35 g/dL (ref 30.0–36.0)
MCV: 90.9 fL (ref 78.0–100.0)
Monocytes Absolute: 0.5 10*3/uL (ref 0.1–1.0)
Monocytes Relative: 6 %
NEUTROS PCT: 68 %
Neutro Abs: 6.1 10*3/uL (ref 1.7–7.7)
PLATELETS: 271 10*3/uL (ref 150–400)
RBC: 4.97 MIL/uL (ref 4.22–5.81)
RDW: 12.8 % (ref 11.5–15.5)
WBC: 9 10*3/uL (ref 4.0–10.5)

## 2017-01-08 LAB — COMPREHENSIVE METABOLIC PANEL
ALT: 10 U/L — ABNORMAL LOW (ref 17–63)
ANION GAP: 10 (ref 5–15)
AST: 16 U/L (ref 15–41)
Albumin: 5.4 g/dL — ABNORMAL HIGH (ref 3.5–5.0)
Alkaline Phosphatase: 42 U/L (ref 38–126)
BILIRUBIN TOTAL: 1 mg/dL (ref 0.3–1.2)
BUN: 18 mg/dL (ref 6–20)
CO2: 26 mmol/L (ref 22–32)
Calcium: 10.9 mg/dL — ABNORMAL HIGH (ref 8.9–10.3)
Chloride: 104 mmol/L (ref 101–111)
Creatinine, Ser: 0.86 mg/dL (ref 0.61–1.24)
Glucose, Bld: 90 mg/dL (ref 65–99)
POTASSIUM: 3.5 mmol/L (ref 3.5–5.1)
Sodium: 140 mmol/L (ref 135–145)
TOTAL PROTEIN: 8.3 g/dL — AB (ref 6.5–8.1)

## 2017-01-08 LAB — I-STAT TROPONIN, ED: Troponin i, poc: 0 ng/mL (ref 0.00–0.08)

## 2017-01-08 LAB — LIPASE, BLOOD: LIPASE: 26 U/L (ref 11–51)

## 2017-01-08 MED ORDER — DICYCLOMINE HCL 20 MG PO TABS: 20.0000 mg | ORAL_TABLET | Freq: Two times a day (BID) | ORAL | 0 refills | Status: DC

## 2017-01-08 MED ORDER — HYDROMORPHONE HCL 1 MG/ML IJ SOLN
1.0000 mg | Freq: Once | INTRAMUSCULAR | Status: AC
  Administered 2017-01-08: 1 mg via INTRAVENOUS
  Filled 2017-01-08: qty 1

## 2017-01-08 MED ORDER — HYDROXYZINE HCL 25 MG PO TABS: 25.0000 mg | ORAL_TABLET | Freq: Three times a day (TID) | ORAL | 0 refills | Status: DC | PRN

## 2017-01-08 MED ORDER — ONDANSETRON HCL 4 MG/2ML IJ SOLN
4.0000 mg | Freq: Once | INTRAMUSCULAR | Status: AC
  Administered 2017-01-08: 4 mg via INTRAVENOUS
  Filled 2017-01-08: qty 2

## 2017-01-08 MED ORDER — GI COCKTAIL ~~LOC~~
30.0000 mL | Freq: Once | ORAL | Status: AC
  Administered 2017-01-08: 30 mL via ORAL
  Filled 2017-01-08: qty 30

## 2017-01-08 MED ORDER — FAMOTIDINE 20 MG PO TABS
40.0000 mg | ORAL_TABLET | Freq: Once | ORAL | Status: AC
  Administered 2017-01-08: 40 mg via ORAL
  Filled 2017-01-08: qty 2

## 2017-01-08 MED ORDER — IOPAMIDOL (ISOVUE-300) INJECTION 61%
100.0000 mL | Freq: Once | INTRAVENOUS | Status: AC | PRN
  Administered 2017-01-08: 100 mL via INTRAVENOUS

## 2017-01-08 MED ORDER — SUCRALFATE 1 GM/10ML PO SUSP: 1.0000 g | Freq: Three times a day (TID) | ORAL | 0 refills | Status: DC

## 2017-01-08 MED ORDER — PANTOPRAZOLE SODIUM 20 MG PO TBEC: 20.0000 mg | DELAYED_RELEASE_TABLET | Freq: Two times a day (BID) | ORAL | 0 refills | Status: DC

## 2017-01-08 NOTE — ED Triage Notes (Signed)
Pt c/o bilateral epigastric pain that radiates to chest area for the past 7 days, states that it feels like indigestion, has been nauseous at times, pain is unchanged by tums,

## 2017-01-08 NOTE — ED Provider Notes (Signed)
[facility] Provider Note   CSN: [id number] Arrival date & time: 01/08/17  0002     History   Chief Complaint Chief Complaint  Patient presents with  . Gastroesophageal Reflux    HPI Ryan Valentine is a 36 y.o. male.  Patient presents to the emergency department for evaluation of abdominal pain and acid reflux. He reports that he has noticed a lot of increased acid reflux over the last week or more. Today, however, he has had persistent pain in the epigastric area radiating into his back. This has been ongoing for 7 hours. He is taken Pepto-Bismol and several other home remedies without any improvement. He feels nauseated but has not had any vomiting.  Also concerned about panic attacks. He has been having episodes of feeling severe anxiety. He thinks it's related to the fact that his wife has a serious illness.      Past Medical History:  Diagnosis Date  . Back pain   . GERD (gastroesophageal reflux disease)   . Headache(784.0)    occ headaches  . Substance abuse     Patient Active Problem List   Diagnosis Date Noted  . Polysubstance abuse 11/21/2013  . Acute encephalopathy 11/21/2013  . Acute respiratory failure (HCC) 11/21/2013  . Opiate overdose 11/20/2013  . Intentional opiate overdose (HCC) 11/20/2013  . Facial cellulitis 02/12/2012  . Tobacco abuse 02/12/2012  . Lumbar herniated disc 06/05/2011    Past Surgical History:  Procedure Laterality Date  . BACK SURGERY    . KNEE ARTHROSCOPY    . LUMBAR LAMINECTOMY/DECOMPRESSION MICRODISCECTOMY  06/05/2011   Procedure: LUMBAR LAMINECTOMY/DECOMPRESSION MICRODISCECTOMY;  Surgeon: Temple Pacini, MD;  Location: [facility];  Service: Neurosurgery;  Laterality: Right;  Right Lumbar five-Sacral one laminectomy/microdiscectomy       Home Medications    Prior to Admission medications   Medication Sig Start Date End Date Taking? Authorizing Provider  OxyCODONE HCl (OXYCONTIN PO) Take 10 mg by mouth 4  (four) times daily.   Yes [provider]  dicyclomine (BENTYL) 20 MG tablet Take 1 tablet (20 mg total) by mouth 2 (two) times daily. 01/08/17   Gilda Crease, MD  hydrOXYzine (ATARAX/VISTARIL) 25 MG tablet Take 1 tablet (25 mg total) by mouth every 8 (eight) hours as needed for anxiety. 01/08/17   Gilda Crease, MD  pantoprazole (PROTONIX) 20 MG tablet Take 1 tablet (20 mg total) by mouth 2 (two) times daily. 01/08/17   Gilda Crease, MD  sucralfate (CARAFATE) 1 GM/10ML suspension Take 10 mLs (1 g total) by mouth 4 (four) times daily -  with meals and at bedtime. 01/08/17   Gilda Crease, MD    Family History Family History  Problem Relation Age of Onset  . Diabetes Mother   . Hypertension Mother   . Diabetes Father     Social History Social History  Substance Use Topics  . Smoking status: Current Every Day Smoker    Packs/day: 1.00    Types: Cigarettes  . Smokeless tobacco: Never Used  . Alcohol use 0.0 oz/week     Comment: socially     Allergies   Ms contin [morphine sulfate]; Penicillins; Tramadol; Erythrocin; and Ketorolac tromethamine   Review of Systems Review of Systems  Gastrointestinal: Positive for abdominal pain.  Psychiatric/Behavioral: The patient is nervous/anxious.   All other systems reviewed and are negative.    Physical Exam Updated Vital Signs BP (!) 134/94   Pulse 81   Temp 98.2  F (36.8 C)   Resp 18   Ht 6\' 5"  (1.956 m)   Wt 90.7 kg (200 lb)   SpO2 100%   BMI 23.72 kg/m   Physical Exam  Constitutional: He is oriented to person, place, and time. He appears well-developed and well-nourished. No distress.  HENT:  Head: Normocephalic and atraumatic.  Right Ear: Hearing normal.  Left Ear: Hearing normal.  Nose: Nose normal.  Mouth/Throat: Oropharynx is clear and moist and mucous membranes are normal.  Eyes: Pupils are equal, round, and reactive to light. Conjunctivae and EOM are normal.  Neck:  Normal range of motion. Neck supple.  Cardiovascular: Regular rhythm, S1 normal and S2 normal.  Exam reveals no gallop and no friction rub.   No murmur heard. Pulmonary/Chest: Effort normal and breath sounds normal. No respiratory distress. He exhibits no tenderness.  Abdominal: Soft. Normal appearance and bowel sounds are normal. There is no hepatosplenomegaly. There is tenderness in the right upper quadrant, epigastric area and left upper quadrant. There is no rebound, no guarding, no tenderness at McBurney's point and negative Murphy's sign. No hernia.  Musculoskeletal: Normal range of motion.  Neurological: He is alert and oriented to person, place, and time. He has normal strength. No cranial nerve deficit or sensory deficit. Coordination normal. GCS eye subscore is 4. GCS verbal subscore is 5. GCS motor subscore is 6.  Skin: Skin is warm, dry and intact. No rash noted. No cyanosis.  Psychiatric: He has a normal mood and affect. His speech is normal and behavior is normal. Thought content normal.  Nursing note and vitals reviewed.    ED Treatments / Results  Labs (all labs ordered are listed, but only abnormal results are displayed) Labs Reviewed  COMPREHENSIVE METABOLIC PANEL - Abnormal; Notable for the following:       Result Value   Calcium 10.9 (*)    Total Protein 8.3 (*)    Albumin 5.4 (*)    ALT 10 (*)    All other components within normal limits  CBC WITH DIFFERENTIAL/PLATELET  LIPASE, BLOOD  I-STAT TROPONIN, ED    EKG  EKG Interpretation  Date/Time:  Monday January 08 2017 00:12:32 EDT Ventricular Rate:  81 PR Interval:    QRS Duration: 100 QT Interval:  349 QTC Calculation: 406 R Axis:   83 Text Interpretation:  Sinus rhythm Right atrial enlargement Probable left ventricular hypertrophy Confirmed by Gilda Crease [phone]) on 01/08/2017 12:23:45 AM       Radiology No results found.  Procedures Procedures (including critical care time)  Medications  Ordered in ED Medications  famotidine (PEPCID) tablet 40 mg (40 mg Oral Given 01/08/17 0025)  gi cocktail (Maalox,Lidocaine,Donnatal) (30 mLs Oral Given 01/08/17 0026)     Initial Impression / Assessment and Plan / ED Course  I have reviewed the triage vital signs and the nursing notes.  Pertinent labs & imaging results that were available during my care of the patient were reviewed by me and considered in my medical decision making (see chart for details).     Patient presents to the emergency department for evaluation of epigastric pain. Patient reports that he has been experiencing intermittent recurrent pain similar to this for the last week. He has been having an extended episode tonight that has lasted proximally 7 hours. Patient does have tenderness in the epigastric area but no Murphy sign. He does not have any guarding or rebound, no sign of peritonitis. Blood work is normal. This includes normal LFTs  and lipase.  Symptoms not consistent with acute cholecystitis, but still in the differential diagnosis. Recommend return tomorrow for outpatient ultrasound. Will treat aggressively for GERD, follow-up with primary doctor.  Agent complaining ofincreased anxiety and panic attacks as well. Will provide Vistaril, refer back to primary care doctor.  Final Clinical Impressions(s) / ED Diagnoses   Final diagnoses:  Epigastric pain  Anxiety    New Prescriptions New Prescriptions   DICYCLOMINE (BENTYL) 20 MG TABLET    Take 1 tablet (20 mg total) by mouth 2 (two) times daily.   HYDROXYZINE (ATARAX/VISTARIL) 25 MG TABLET    Take 1 tablet (25 mg total) by mouth every 8 (eight) hours as needed for anxiety.   PANTOPRAZOLE (PROTONIX) 20 MG TABLET    Take 1 tablet (20 mg total) by mouth 2 (two) times daily.   SUCRALFATE (CARAFATE) 1 GM/10ML SUSPENSION    Take 10 mLs (1 g total) by mouth 4 (four) times daily -  with meals and at bedtime.     Gilda Crease, MD 01/08/17 [phone]

## 2017-01-08 NOTE — ED Notes (Signed)
Pt states he feels like the pressure he was having was gone but is holding pressure to his rlq.

## 2017-02-10 ENCOUNTER — Encounter (HOSPITAL_COMMUNITY): Payer: Self-pay

## 2017-02-10 ENCOUNTER — Emergency Department (HOSPITAL_COMMUNITY)
Admission: EM | Admit: 2017-02-10 | Discharge: 2017-02-10 | Disposition: A | Discharge status: Home / Self Care | Payer: BLUE CROSS/BLUE SHIELD | Attending: Emergency Medicine | Admitting: Emergency Medicine

## 2017-02-10 DIAGNOSIS — Z79899 Other long term (current) drug therapy: Secondary | ICD-10-CM | POA: Insufficient documentation

## 2017-02-10 DIAGNOSIS — M5441 Lumbago with sciatica, right side: Secondary | ICD-10-CM | POA: Insufficient documentation

## 2017-02-10 DIAGNOSIS — F17210 Nicotine dependence, cigarettes, uncomplicated: Secondary | ICD-10-CM | POA: Insufficient documentation

## 2017-02-10 DIAGNOSIS — G8929 Other chronic pain: Secondary | ICD-10-CM

## 2017-02-10 MED ORDER — DEXAMETHASONE SODIUM PHOSPHATE 10 MG/ML IJ SOLN
10.0000 mg | Freq: Once | INTRAMUSCULAR | Status: DC
  Filled 2017-02-10: qty 1

## 2017-02-10 MED ORDER — CYCLOBENZAPRINE HCL 5 MG PO TABS: 5.0000 mg | ORAL_TABLET | Freq: Three times a day (TID) | ORAL | 0 refills | Status: DC | PRN

## 2017-02-10 MED ORDER — DIAZEPAM 5 MG PO TABS
5.0000 mg | ORAL_TABLET | Freq: Once | ORAL | Status: AC
  Administered 2017-02-10: 5 mg via ORAL
  Filled 2017-02-10: qty 1

## 2017-02-10 MED ORDER — PREDNISONE 20 MG PO TABS: ORAL_TABLET | ORAL | 0 refills | Status: DC

## 2017-02-10 NOTE — ED Triage Notes (Signed)
Pt states he has been told he needs spinal fusion surgery, states he is having pain to his lower back and now radiates down into his right leg.

## 2017-02-10 NOTE — ED Provider Notes (Signed)
[facility] Provider Note   CSN: [id number] Arrival date & time: 02/10/17  0327  Time seen 04:28 AM   History   Chief Complaint Chief Complaint  Patient presents with  . Back Pain    HPI Ryan Valentine is a 36 y.o. male.  HPI  Patient reports he had back surgery about 11 years ago by Dr. Jordan Likes, neurosurgery.He reports he's been having a lot of lower back pain. He states he was seen by his primary care doctor 5 days ago and was told he has 5 disc in his back that need to be repaired however he's waiting for his insurance. He also goes on to talk about having panic attacks in his doctor started him on lorazepam. He also states she's been having trouble concentrating and used to have ADHD as a child. He is requesting to be started on methylphenidate. He was advised his primary care doctor would need to start that. He finally starts talking about his back pain. He states he is having pain in his upper thigh. He has had pain radiating into his buttock into his right thigh for at least the past 3 months.He also is having numbness in his bilateral lower leg. He states he's been having difficulty urinating for the past 2 weeks but does not have difficulty with his defecation. Patient states his primary care doctor has been increasing the number of his pain pills. He states currently he is taking 5-6 a day and knows he is going to run out too soon. He states they were last prescribed 3 days ago. He states "I want the shot I was given the last time I came here".  Patient also states she's had a low-grade fever to 100 with some mild cough, sneezing, mild sore throat.  PCP Oval Linsey, MD   Past Medical History:  Diagnosis Date  . Back pain   . GERD (gastroesophageal reflux disease)   . Headache(784.0)    occ headaches  . Substance abuse     Patient Active Problem List   Diagnosis Date Noted  . Polysubstance abuse 11/21/2013  . Acute encephalopathy 11/21/2013  . Acute  respiratory failure (HCC) 11/21/2013  . Opiate overdose 11/20/2013  . Intentional opiate overdose (HCC) 11/20/2013  . Facial cellulitis 02/12/2012  . Tobacco abuse 02/12/2012  . Lumbar herniated disc 06/05/2011    Past Surgical History:  Procedure Laterality Date  . BACK SURGERY    . KNEE ARTHROSCOPY    . LUMBAR LAMINECTOMY/DECOMPRESSION MICRODISCECTOMY  06/05/2011   Procedure: LUMBAR LAMINECTOMY/DECOMPRESSION MICRODISCECTOMY;  Surgeon: Temple Pacini, MD;  Location: [facility];  Service: Neurosurgery;  Laterality: Right;  Right Lumbar five-Sacral one laminectomy/microdiscectomy       Home Medications    Prior to Admission medications   Medication Sig Start Date End Date Taking? Authorizing Provider  OxyCODONE HCl (OXYCONTIN PO) Take 10 mg by mouth 4 (four) times daily.   Yes [provider]  cyclobenzaprine (FLEXERIL) 5 MG tablet Take 1 tablet (5 mg total) by mouth 3 (three) times daily as needed. 02/10/17   Devoria Albe, MD  dicyclomine (BENTYL) 20 MG tablet Take 1 tablet (20 mg total) by mouth 2 (two) times daily. 01/08/17   Gilda Crease, MD  hydrOXYzine (ATARAX/VISTARIL) 25 MG tablet Take 1 tablet (25 mg total) by mouth every 8 (eight) hours as needed for anxiety. 01/08/17   Gilda Crease, MD  pantoprazole (PROTONIX) 20 MG tablet Take 1 tablet (20 mg total) by mouth  2 (two) times daily. 01/08/17   Gilda Crease, MD  predniSONE (DELTASONE) 20 MG tablet Take 3 po QD x 3d , then 2 po QD x 3d then 1 po QD x 3d 02/10/17   Devoria Albe, MD  sucralfate (CARAFATE) 1 GM/10ML suspension Take 10 mLs (1 g total) by mouth 4 (four) times daily -  with meals and at bedtime. 01/08/17   Gilda Crease, MD    Family History Family History  Problem Relation Age of Onset  . Diabetes Mother   . Hypertension Mother   . Diabetes Father     Social History Social History  Substance Use Topics  . Smoking status: Current Every Day Smoker    Packs/day: 1.00     Types: Cigarettes  . Smokeless tobacco: Never Used  . Alcohol use 0.0 oz/week     Comment: socially  employed Lives with spouse   Allergies   Ms contin [morphine sulfate]; Penicillins; Tramadol; Erythrocin; and Ketorolac tromethamine   Review of Systems Review of Systems  All other systems reviewed and are negative.    Physical Exam Updated Vital Signs BP (!) 124/91 (BP Location: Left Arm)   Pulse 72   Temp 98.2 F (36.8 C) (Oral)   Resp (!) 22   Ht  (1.93 m)   Wt 90.7 kg (200 lb)   SpO2 96%   BMI 24.34 kg/m   Vital signs normal    Physical Exam  Constitutional: He is oriented to person, place, and time. He appears well-developed and well-nourished.  Non-toxic appearance. He does not appear ill. No distress.  Patient appears much older than his stated age.  HENT:  Head: Normocephalic and atraumatic.  Right Ear: External ear normal.  Left Ear: External ear normal.  Nose: Nose normal. No mucosal edema or rhinorrhea.  Mouth/Throat: Oropharynx is clear and moist and mucous membranes are normal. No dental abscesses or uvula swelling.  Eyes: Pupils are equal, round, and reactive to light. Conjunctivae and EOM are normal.  Neck: Normal range of motion and full passive range of motion without pain. Neck supple.  Cardiovascular: Normal rate, regular rhythm and normal heart sounds.  Exam reveals no gallop and no friction rub.   No murmur heard. Pulmonary/Chest: Effort normal and breath sounds normal. No respiratory distress. He has no wheezes. He has no rhonchi. He has no rales. He exhibits no tenderness and no crepitus.  Abdominal: Soft. Normal appearance and bowel sounds are normal. He exhibits no distension. There is no tenderness. There is no rebound and no guarding.  Musculoskeletal: Normal range of motion. He exhibits no edema or tenderness.       Back:  Moves all extremities well. patient has tenderness along his right paraspinous muscles and into the right  buttock in the sciatic notch area.his reflexes are equal. Patient has normal gait. Patient actually changes positions on the stretcher easily without a lot of discomfort.  Neurological: He is alert and oriented to person, place, and time. He has normal strength. No cranial nerve deficit.  Skin: Skin is warm, dry and intact. No rash noted. No erythema. No pallor.  Psychiatric: He has a normal mood and affect. His speech is normal and behavior is normal. His mood appears not anxious.  Nursing note and vitals reviewed.    ED Treatments / Results  Labs (all labs ordered are listed, but only abnormal results are displayed) Labs Reviewed - No data to display  EKG  EKG Interpretation None  Radiology No results found.    October 30, 2016 CLINICAL DATA:  36 y/o M; mid and lower back pain with bilateral arm and leg weakness.  EXAM: MRI LUMBAR SPINE WITHOUT CONTRAST  TECHNIQUE: Multiplanar, multisequence MR imaging of the lumbar spine was performed. No intravenous contrast was administered.  COMPARISON:  None.   IMPRESSION: 1. Lumbar degenerative changes greatest at the L3-4 and L4-5 levels. 2. L3-4 right subarticular moderate-sized disc protrusion effacing right lateral recess with likely impingement on descending right L4 nerve root. 3. Mild right L3-4 and bilateral L4-5 foraminal narrowing. 4. Mild L3-4 and L4-5 canal stenosis.   Electronically Signed   By: Mitzi Hansen M.D.   On: 10/30/2016 19:56  October 30, 2016 CLINICAL DATA:  36 y/o M; mid and lower back pain with bilateral arm and leg weakness.  EXAM: MRI THORACIC SPINE WITHOUT CONTRAST   IMPRESSION: 1. No abnormal cord signal. 2. No acute osseous abnormality. 3. Mild discogenic degenerative changes of the thoracic spine with tiny disc protrusions at the T4-5 and T6-7 levels. No cord impingement.   Electronically Signed   By: Mitzi Hansen M.D.   On: 10/30/2016  20:04   January 08, 2017 Show images for CT Abdomen Pelvis W Contrast  Study Result   CLINICAL DATA:  Abdominal pain unspecified. Epigastric pain radiating to the chest. Nausea.  EXAM: CT ABDOMEN AND PELVIS WITH CONTRAST  TECHNIQUE: Multidetector CT imaging of the abdomen and pelvis was performed using the standard protocol following bolus administration of intravenous contrast.   COMPARISON:  None.   IMPRESSION: No acute abnormality or explanation for abdominal pain.   Electronically Signed   By: Rubye Oaks M.D.   On: 01/08/2017 03:11      Procedures Procedures (including critical care time)   Medications Ordered in ED Medications  dexamethasone (DECADRON) injection 10 mg (10 mg Intravenous Refused 02/10/17 0457)  diazepam (VALIUM) tablet 5 mg (5 mg Oral Given 02/10/17 0453)     Initial Impression / Assessment and Plan / ED Course  I have reviewed the triage vital signs and the nursing notes.  Pertinent labs & imaging results that were available during my care of the patient were reviewed by me and considered in my medical decision making (see chart for details).     I have reviewed patient's prior ED visits.he was seen in 2014 and 2013, and then he has a visit in April 2018 where he was given 1000 mg of acetaminophen, August 27 he was seen for abdominal pain and he was given a GI cocktail, Pepcid, and Bentyl IM.  Bladder scan 155 cc  Patient insists he was seen in the ED 3 weeks ago and got a injection of Dilaudid and Valium. When I look at the medication list and at the ED charts a do not see where this was given. I asked him if he gone to [facility] ED however he states "I don't go there".    Patient was ordered Decadron IM which he refused and Valium 5 mg orally. At this point I do not feel like patient needs any more narcotic medications. He was discharged home with steroids and a muscle relaxer. He should follow-up with his primary care doctor  for further management of his chronic pain.  Please note patient has had a prior ED visit in July 2015 for opiate overdose. He was admitted to the hospital that time.  Review of [place] database shows patient was getting 30 oxycodone in April which  increased to 60 in May and then 120 in June and then increase to 150 tabs in August 27 and then 150 tablets oxycodone 10 mg tablets on September 27. He also was prescribed #30 lorazepam on August 27.  Final Clinical Impressions(s) / ED Diagnoses   Final diagnoses:  Chronic right-sided low back pain with right-sided sciatica    New Prescriptions Discharge Medication List as of 02/10/2017  5:07 AM    START taking these medications   Details  cyclobenzaprine (FLEXERIL) 5 MG tablet Take 1 tablet (5 mg total) by mouth 3 (three) times daily as needed., Starting Sat 02/10/2017, Print    predniSONE (DELTASONE) 20 MG tablet Take 3 po QD x 3d , then 2 po QD x 3d then 1 po QD x 3d, Print        Plan discharge  Devoria Albe, MD, Concha Pyo, MD 02/10/17 [phone]

## 2017-02-10 NOTE — ED Notes (Signed)
ED Provider at bedside. 

## 2017-02-10 NOTE — Discharge Instructions (Signed)
Use ice and heat for comfort. Take the medications as prescribed. You will need to follow up with Dr Janna Arch about your pain medications and getting the neurosurgical referral.

## 2017-02-10 NOTE — ED Notes (Signed)
Pt states his wife dropped him off and will pick him up when he is ready to go

## 2017-04-10 ENCOUNTER — Emergency Department (HOSPITAL_COMMUNITY): Payer: Self-pay

## 2017-04-10 ENCOUNTER — Other Ambulatory Visit: Payer: Self-pay

## 2017-04-10 ENCOUNTER — Encounter (HOSPITAL_COMMUNITY): Payer: Self-pay | Admitting: Emergency Medicine

## 2017-04-10 ENCOUNTER — Emergency Department (HOSPITAL_COMMUNITY)
Admission: EM | Admit: 2017-04-10 | Discharge: 2017-04-10 | Disposition: A | Discharge status: Home / Self Care | Payer: Self-pay | Attending: Emergency Medicine | Admitting: Emergency Medicine

## 2017-04-10 DIAGNOSIS — F17210 Nicotine dependence, cigarettes, uncomplicated: Secondary | ICD-10-CM | POA: Insufficient documentation

## 2017-04-10 DIAGNOSIS — M549 Dorsalgia, unspecified: Secondary | ICD-10-CM | POA: Insufficient documentation

## 2017-04-10 DIAGNOSIS — T402X1A Poisoning by other opioids, accidental (unintentional), initial encounter: Secondary | ICD-10-CM | POA: Insufficient documentation

## 2017-04-10 DIAGNOSIS — T50905A Adverse effect of unspecified drugs, medicaments and biological substances, initial encounter: Secondary | ICD-10-CM

## 2017-04-10 LAB — COMPREHENSIVE METABOLIC PANEL
ALT: 13 U/L — ABNORMAL LOW (ref 17–63)
ANION GAP: 8 (ref 5–15)
AST: 22 U/L (ref 15–41)
Albumin: 4.9 g/dL (ref 3.5–5.0)
Alkaline Phosphatase: 50 U/L (ref 38–126)
BILIRUBIN TOTAL: 0.4 mg/dL (ref 0.3–1.2)
BUN: 18 mg/dL (ref 6–20)
CO2: 27 mmol/L (ref 22–32)
Calcium: 9.1 mg/dL (ref 8.9–10.3)
Chloride: 104 mmol/L (ref 101–111)
Creatinine, Ser: 0.92 mg/dL (ref 0.61–1.24)
GFR calc Af Amer: >60 mL/min (ref >60)
GLUCOSE: 103 mg/dL — AB (ref 65–99)
POTASSIUM: 3.4 mmol/L — AB (ref 3.5–5.1)
SODIUM: 139 mmol/L (ref 135–145)
TOTAL PROTEIN: 7.4 g/dL (ref 6.5–8.1)

## 2017-04-10 LAB — CBC WITH DIFFERENTIAL/PLATELET
BASOS ABS: 0 10*3/uL (ref 0.0–0.1)
BASOS PCT: 0 %
EOS ABS: 0.1 10*3/uL (ref 0.0–0.7)
EOS PCT: 1 %
HCT: 44.6 % (ref 39.0–52.0)
Hemoglobin: 14.6 g/dL (ref 13.0–17.0)
Lymphocytes Relative: 27 %
Lymphs Abs: 2.4 10*3/uL (ref 0.7–4.0)
MCH: 30.2 pg (ref 26.0–34.0)
MCHC: 32.7 g/dL (ref 30.0–36.0)
MCV: 92.1 fL (ref 78.0–100.0)
Monocytes Absolute: 0.6 10*3/uL (ref 0.1–1.0)
Monocytes Relative: 7 %
Neutro Abs: 5.8 10*3/uL (ref 1.7–7.7)
Neutrophils Relative %: 65 %
PLATELETS: 251 10*3/uL (ref 150–400)
RBC: 4.84 MIL/uL (ref 4.22–5.81)
RDW: 13 % (ref 11.5–15.5)
WBC: 9 10*3/uL (ref 4.0–10.5)

## 2017-04-10 LAB — ACETAMINOPHEN LEVEL
ACETAMINOPHEN (TYLENOL), SERUM: 15 ug/mL (ref 10–30)
Acetaminophen (Tylenol), Serum: 49 ug/mL — ABNORMAL HIGH (ref 10–30)

## 2017-04-10 LAB — ETHANOL

## 2017-04-10 LAB — CBG MONITORING, ED: GLUCOSE-CAPILLARY: 83 mg/dL (ref 65–99)

## 2017-04-10 LAB — SALICYLATE LEVEL

## 2017-04-10 MED ORDER — NALOXONE HCL 0.4 MG/ML IJ SOLN
0.1000 mg | Freq: Once | INTRAMUSCULAR | Status: AC
  Administered 2017-04-10: 0.1 mg via INTRAVENOUS
  Filled 2017-04-10: qty 1

## 2017-04-10 MED ORDER — SODIUM CHLORIDE 0.9 % IV SOLN
INTRAVENOUS | Status: DC
  Administered 2017-04-10: 16:00:00 via INTRAVENOUS

## 2017-04-10 MED ORDER — SODIUM CHLORIDE 0.9 % IV BOLUS (SEPSIS)
1000.0000 mL | Freq: Once | INTRAVENOUS | Status: AC
  Administered 2017-04-10: 1000 mL via INTRAVENOUS

## 2017-04-10 NOTE — Discharge Instructions (Signed)
Take your medications as prescribed.  Follow-up with your doctor regarding your pain medications.
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
## 2017-04-10 NOTE — ED Triage Notes (Signed)
PT brought in by [facility] today d/t possible opoid overdose per pt's wife. PT [place] [facility] [place] [facility] [place] [facility] and was [facility] bc of possible impairment and was release with her. PT lethargic upon arrival to ED today but states he only took one pain pill this morning. PT wife states that she believes he could have took 30 tablets this evening. PT denies any SI/HI. [facility] with pt bc he had to be forcefully brought in to ED and handcuffed at home.

## 2018-05-04 ENCOUNTER — Encounter (HOSPITAL_COMMUNITY): Payer: Self-pay | Admitting: Emergency Medicine

## 2018-05-04 ENCOUNTER — Emergency Department (HOSPITAL_COMMUNITY)
Admission: EM | Admit: 2018-05-04 | Discharge: 2018-05-04 | Disposition: A | Discharge status: Home / Self Care | Payer: BLUE CROSS/BLUE SHIELD | Attending: Emergency Medicine | Admitting: Emergency Medicine

## 2018-05-04 ENCOUNTER — Other Ambulatory Visit: Payer: Self-pay

## 2018-05-04 DIAGNOSIS — Z79899 Other long term (current) drug therapy: Secondary | ICD-10-CM | POA: Insufficient documentation

## 2018-05-04 DIAGNOSIS — F17210 Nicotine dependence, cigarettes, uncomplicated: Secondary | ICD-10-CM | POA: Insufficient documentation

## 2018-05-04 DIAGNOSIS — K529 Noninfective gastroenteritis and colitis, unspecified: Secondary | ICD-10-CM | POA: Insufficient documentation

## 2018-05-04 DIAGNOSIS — R1110 Vomiting, unspecified: Secondary | ICD-10-CM | POA: Diagnosis present

## 2018-05-04 MED ORDER — ONDANSETRON 4 MG PO TBDP: 4.0000 mg | ORAL_TABLET | Freq: Three times a day (TID) | ORAL | 0 refills | Status: DC | PRN

## 2018-05-04 NOTE — ED Triage Notes (Signed)
Pt c/o emesis and diarrhea for 24 hours, reports subsided this morning around 0200 but still has some middle abd pain

## 2018-05-04 NOTE — Discharge Instructions (Signed)
If you develop fever, vomiting blood, uncontrolled vomiting, severe abdominal pain, weakness, or any other new/concerning symptoms then return to the ER for evaluation.

## 2018-05-04 NOTE — ED Provider Notes (Signed)
[facility] EMERGENCY DEPARTMENT Provider Note   CSN: [id number] Arrival date & time: 05/04/18  0920     History   Chief Complaint Chief Complaint  Patient presents with  . Emesis    HPI Ryan Valentine is a 37 y.o. male.  HPI  37 year old male presents requesting a work note and prescription for Zofran.  He states that starting 2 days ago he developed vomiting, diarrhea and abdominal cramping.  He had significant chills the first day but no obvious fever.  He states multiple kids in his house have had the stomach flu.  He never had any blood in his emesis or diarrhea.  He took 1 of his grandfathers Zofran at around 1 AM and this seemed to help.  He still has some residual nausea but no abdominal pain.  His diarrhea is much improved and wants not normal it is no longer severely loose.  No recent travel or antibiotic use.  He states he feels a little weak overall but is tolerating oral fluids.  He states he needs a note to go back to work in 2 days.  Past Medical History:  Diagnosis Date  . Back pain   . GERD (gastroesophageal reflux disease)   . Headache(784.0)    occ headaches  . Substance abuse [facility])     Patient Active Problem List   Diagnosis Date Noted  . Polysubstance abuse (HCC) 11/21/2013  . Acute encephalopathy 11/21/2013  . Acute respiratory failure (HCC) 11/21/2013  . Opiate overdose (HCC) 11/20/2013  . Intentional opiate overdose (HCC) 11/20/2013  . Facial cellulitis 02/12/2012  . Tobacco abuse 02/12/2012  . Lumbar herniated disc 06/05/2011    Past Surgical History:  Procedure Laterality Date  . BACK SURGERY    . KNEE ARTHROSCOPY    . LUMBAR LAMINECTOMY/DECOMPRESSION MICRODISCECTOMY  06/05/2011   Procedure: LUMBAR LAMINECTOMY/DECOMPRESSION MICRODISCECTOMY;  Surgeon: Temple Pacini, MD;  Location: [facility];  Service: Neurosurgery;  Laterality: Right;  Right Lumbar five-Sacral one laminectomy/microdiscectomy        Home Medications    Prior to  Admission medications   Medication Sig Start Date End Date Taking? Authorizing Provider  cyclobenzaprine (FLEXERIL) 5 MG tablet Take 1 tablet (5 mg total) by mouth 3 (three) times daily as needed. 02/10/17   Devoria Albe, MD  dicyclomine (BENTYL) 20 MG tablet Take 1 tablet (20 mg total) by mouth 2 (two) times daily. 01/08/17   Gilda Crease, MD  hydrOXYzine (ATARAX/VISTARIL) 25 MG tablet Take 1 tablet (25 mg total) by mouth every 8 (eight) hours as needed for anxiety. 01/08/17   Gilda Crease, MD  ondansetron (ZOFRAN ODT) 4 MG disintegrating tablet Take 1 tablet (4 mg total) by mouth every 8 (eight) hours as needed for nausea or vomiting. 05/04/18   Pricilla Loveless, MD  OxyCODONE HCl (OXYCONTIN PO) Take 10 mg by mouth 4 (four) times daily.    [provider]  pantoprazole (PROTONIX) 20 MG tablet Take 1 tablet (20 mg total) by mouth 2 (two) times daily. 01/08/17   Gilda Crease, MD  predniSONE (DELTASONE) 20 MG tablet Take 3 po QD x 3d , then 2 po QD x 3d then 1 po QD x 3d 02/10/17   Devoria Albe, MD  sucralfate (CARAFATE) 1 GM/10ML suspension Take 10 mLs (1 g total) by mouth 4 (four) times daily -  with meals and at bedtime. 01/08/17   Gilda Crease, MD    Family History Family History  Problem Relation  Age of Onset  . Diabetes Mother   . Hypertension Mother   . Diabetes Father     Social History Social History   Tobacco Use  . Smoking status: Current Every Day Smoker    Packs/day: 1.00    Types: Cigarettes  . Smokeless tobacco: Never Used  Substance Use Topics  . Alcohol use: Yes    Comment: socially  . Drug use: Not Currently    Comment: DENIES ANY USE TODAY 08/272018     Allergies   Ms contin [morphine sulfate]; Penicillins; Tramadol; Erythrocin; and Ketorolac tromethamine   Review of Systems Review of Systems  Constitutional: Positive for chills. Negative for fever.  Gastrointestinal: Positive for abdominal pain, diarrhea, nausea and  vomiting. Negative for blood in stool.  Neurological: Positive for weakness.  All other systems reviewed and are negative.    Physical Exam Updated Vital Signs BP 126/79 (BP Location: Right Arm)   Pulse 76   Temp 97.8 F (36.6 C) (Oral)   Resp 17   Ht 6\' 5"  (1.956 m)   Wt 90.7 kg   SpO2 97%   BMI 23.72 kg/m   Physical Exam Vitals signs and nursing note reviewed.  Constitutional:      General: He is not in acute distress.    Appearance: He is well-developed. He is not ill-appearing or diaphoretic.  HENT:     Head: Normocephalic and atraumatic.     Right Ear: External ear normal.     Left Ear: External ear normal.     Nose: Nose normal.  Eyes:     General:        Right eye: No discharge.        Left eye: No discharge.  Neck:     Musculoskeletal: Neck supple.  Cardiovascular:     Rate and Rhythm: Normal rate and regular rhythm.     Heart sounds: Normal heart sounds.  Pulmonary:     Effort: Pulmonary effort is normal.     Breath sounds: Normal breath sounds.  Abdominal:     Palpations: Abdomen is soft.     Tenderness: There is no abdominal tenderness.  Skin:    General: Skin is warm and dry.  Neurological:     Mental Status: He is alert.  Psychiatric:        Mood and Affect: Mood is not anxious.      ED Treatments / Results  Labs (all labs ordered are listed, but only abnormal results are displayed) Labs Reviewed - No data to display  EKG None  Radiology No results found.  Procedures Procedures (including critical care time)  Medications Ordered in ED Medications - No data to display   Initial Impression / Assessment and Plan / ED Course  I have reviewed the triage vital signs and the nursing notes.  Pertinent labs & imaging results that were available during my care of the patient were reviewed by me and considered in my medical decision making (see chart for details).     Patient overall appears pretty well.  His vital signs are benign.  I  doubt severe dehydration though I did offer IV fluids and to evaluate his electrolytes with blood work for symptomatic relief and to eval for hypokalemia or renal injury.  He declines this which I think is reasonable given he already seems to be improving.  I will prescribe him Zofran and I have given his work note.  Return precautions.  I highly doubt acute intra-abdominal emergency given  no abdominal tenderness now and improvement of symptoms.  Final Clinical Impressions(s) / ED Diagnoses   Final diagnoses:  Gastroenteritis    ED Discharge Orders         Ordered    ondansetron (ZOFRAN ODT) 4 MG disintegrating tablet  Every 8 hours PRN     05/04/18 0959           Pricilla LovelessGoldston, Kemani Heidel, MD 05/04/18 1004

## 2018-05-04 NOTE — ED Notes (Signed)
EDP at bedside  

## 2018-05-04 NOTE — ED Notes (Signed)
Pt reports n/v/d and abdominal cramping for the past 1-2 days. States symptoms are improving. Reports he needs a work note because he does not feel well enough to go to work [employer]. Also requesting prescription for PO Zofran.

## 2019-07-31 ENCOUNTER — Ambulatory Visit (HOSPITAL_COMMUNITY)
Admission: EM | Admit: 2019-07-31 | Discharge: 2019-07-31 | Disposition: A | Discharge status: Home / Self Care | Payer: BLUE CROSS/BLUE SHIELD | Attending: Family Medicine | Admitting: Family Medicine

## 2019-07-31 ENCOUNTER — Encounter (HOSPITAL_COMMUNITY): Payer: Self-pay

## 2019-07-31 DIAGNOSIS — R030 Elevated blood-pressure reading, without diagnosis of hypertension: Secondary | ICD-10-CM

## 2019-07-31 NOTE — ED Triage Notes (Signed)
Patient states his blood pressure has always "ran high" but he has been noticing over the past three weeks his diastolic has been running 120 or over.

## 2019-07-31 NOTE — ED Provider Notes (Signed)
[facility]   [id number] 07/31/19 Arrival Time: 3474  ASSESSMENT & PLAN:  1. Elevated blood pressure reading without diagnosis of hypertension     BP WNL here. Plans to return here tomorrow with his home BP cuff. We can recheck his BP to compare results with his home cuff.   Follow-up Information    Schedule an appointment as soon as possible for a visit  with Lucia Gaskins, MD.   Specialty: Internal Medicine Why: To follow up on blood pressure concerns. Contact information: [place] [address] [phone]           Reviewed expectations re: course of current medical issues. Questions answered. Outlined signs and symptoms indicating need for more acute intervention. Patient verbalized understanding. After Visit Summary given.   SUBJECTIVE:  Ryan Valentine is a 39 y.o. male who presents with concerns regarding increased blood pressures. He reports that he has not been treated for hypertension in the past. Home BP cuff reading high; systolic in 387F; diastolic 643-329. He reports occasional "fuzzy headed" feeling and BP is high while feeling this way. No specific headaches or visual changes.  He reports no chest pain on exertion, no dyspnea on exertion, no swelling of ankles, no orthostatic dizziness or lightheadedness, no orthopnea or paroxysmal nocturnal dyspnea and no palpitations.   Social History   Tobacco Use  Smoking Status Current Every Day Smoker  . Packs/day: 1.00  . Types: Cigarettes  Smokeless Tobacco Never Used      OBJECTIVE:  Vitals:   07/31/19 1708  BP: 132/65  Pulse: (!) 114  Resp: 16  Temp: 98.5 F (36.9 C)  TempSrc: Oral  SpO2: 100%    General appearance: alert; no distress Eyes: PERRLA; EOMI HENT: normocephalic; atraumatic Neck: supple Lungs: clear to auscultation bilaterally Heart: regular rate and rhythm without murmer; slight tachycardia Skin: warm and dry Psychological: alert and  cooperative; normal mood and affect    Allergies  Allergen Reactions  . Ms Contin [Morphine Sulfate] Other (See Comments)    Makes my head feel funny   . Penicillins Other (See Comments)  . Tramadol Other (See Comments)    Feels clammy  . Erythrocin Palpitations  . Ketorolac Tromethamine Anxiety    Past Medical History:  Diagnosis Date  . Back pain   . GERD (gastroesophageal reflux disease)   . Headache(784.0)    occ headaches  . Substance abuse ([place])    Social History   Socioeconomic History  . Marital status: Married    Spouse name: Not on file  . Number of children: Not on file  . Years of education: Not on file  . Highest education level: Not on file  Occupational History  . Not on file  Tobacco Use  . Smoking status: Current Every Day Smoker    Packs/day: 1.00    Types: Cigarettes  . Smokeless tobacco: Never Used  Substance and Sexual Activity  . Alcohol use: Yes    Comment: socially  . Drug use: Not Currently    Comment: DENIES ANY USE TODAY 08/272018  . Sexual activity: Yes  Other Topics Concern  . Not on file  Social History Narrative  . Not on file   Social Determinants of Health   Financial Resource Strain:   . Difficulty of Paying Living Expenses:   Food Insecurity:   . Worried About Charity fundraiser in the Last Year:   . [place] in the Last Year:  Transportation Needs:   . Freight forwarder (Medical):   Marland Kitchen Lack of Transportation (Non-Medical):   Physical Activity:   . Days of Exercise per Week:   . Minutes of Exercise per Session:   Stress:   . Feeling of Stress :   Social Connections:   . Frequency of Communication with Friends and Family:   . Frequency of Social Gatherings with Friends and Family:   . Attends Religious Services:   . Active Member of Clubs or Organizations:   . Attends Banker Meetings:   Marland Kitchen Marital Status:   Intimate Partner Violence:   . Fear of Current or Ex-Partner:   . Emotionally  Abused:   Marland Kitchen Physically Abused:   . Sexually Abused:    Family History  Problem Relation Age of Onset  . Diabetes Mother   . Hypertension Mother   . Diabetes Father    Past Surgical History:  Procedure Laterality Date  . BACK SURGERY    . KNEE ARTHROSCOPY    . LUMBAR LAMINECTOMY/DECOMPRESSION MICRODISCECTOMY  06/05/2011   Procedure: LUMBAR LAMINECTOMY/DECOMPRESSION MICRODISCECTOMY;  Surgeon: Temple Pacini, MD;  Location: [facility];  Service: Neurosurgery;  Laterality: Right;  Right Lumbar five-Sacral one laminectomy/microdiscectomy      Mardella Layman, MD 07/31/19 1756

## 2019-10-20 ENCOUNTER — Emergency Department (HOSPITAL_COMMUNITY)
Admission: EM | Admit: 2019-10-20 | Discharge: 2019-10-20 | Discharge status: Against Medical Advice | Payer: BLUE CROSS/BLUE SHIELD

## 2019-10-20 ENCOUNTER — Emergency Department (HOSPITAL_COMMUNITY)
Admission: EM | Admit: 2019-10-20 | Discharge: 2019-10-20 | Disposition: A | Discharge status: Home / Self Care | Payer: BLUE CROSS/BLUE SHIELD | Attending: Emergency Medicine | Admitting: Emergency Medicine

## 2019-10-20 ENCOUNTER — Encounter (HOSPITAL_COMMUNITY): Payer: Self-pay | Admitting: *Deleted

## 2019-10-20 ENCOUNTER — Other Ambulatory Visit: Payer: Self-pay

## 2019-10-20 DIAGNOSIS — Z5321 Procedure and treatment not carried out due to patient leaving prior to being seen by health care provider: Secondary | ICD-10-CM | POA: Insufficient documentation

## 2019-10-20 DIAGNOSIS — Y999 Unspecified external cause status: Secondary | ICD-10-CM | POA: Insufficient documentation

## 2019-10-20 DIAGNOSIS — T25222A Burn of second degree of left foot, initial encounter: Secondary | ICD-10-CM | POA: Insufficient documentation

## 2019-10-20 DIAGNOSIS — Y939 Activity, unspecified: Secondary | ICD-10-CM | POA: Insufficient documentation

## 2019-10-20 DIAGNOSIS — Y929 Unspecified place or not applicable: Secondary | ICD-10-CM | POA: Insufficient documentation

## 2019-10-20 DIAGNOSIS — X118XXA Contact with other hot tap-water, initial encounter: Secondary | ICD-10-CM | POA: Insufficient documentation
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
## 2019-10-20 NOTE — ED Notes (Signed)
Called pt x 3 no answer 

## 2019-10-21 ENCOUNTER — Emergency Department (HOSPITAL_COMMUNITY)
Admission: EM | Admit: 2019-10-21 | Discharge: 2019-10-21 | Disposition: A | Discharge status: Home / Self Care | Payer: Self-pay | Attending: Emergency Medicine | Admitting: Emergency Medicine

## 2019-10-21 ENCOUNTER — Encounter (HOSPITAL_COMMUNITY): Payer: Self-pay

## 2019-10-21 ENCOUNTER — Other Ambulatory Visit: Payer: Self-pay

## 2019-10-21 DIAGNOSIS — Z5321 Procedure and treatment not carried out due to patient leaving prior to being seen by health care provider: Secondary | ICD-10-CM | POA: Insufficient documentation

## 2019-10-21 DIAGNOSIS — M79672 Pain in left foot: Secondary | ICD-10-CM | POA: Insufficient documentation

## 2019-10-21 DIAGNOSIS — T25222A Burn of second degree of left foot, initial encounter: Secondary | ICD-10-CM | POA: Insufficient documentation

## 2019-10-21 DIAGNOSIS — Z880 Allergy status to penicillin: Secondary | ICD-10-CM | POA: Insufficient documentation

## 2019-10-21 DIAGNOSIS — Z885 Allergy status to narcotic agent status: Secondary | ICD-10-CM | POA: Insufficient documentation

## 2019-10-21 DIAGNOSIS — Z886 Allergy status to analgesic agent status: Secondary | ICD-10-CM | POA: Insufficient documentation

## 2019-10-21 DIAGNOSIS — Z23 Encounter for immunization: Secondary | ICD-10-CM | POA: Insufficient documentation

## 2019-10-21 DIAGNOSIS — Y939 Activity, unspecified: Secondary | ICD-10-CM | POA: Insufficient documentation

## 2019-10-21 DIAGNOSIS — Z881 Allergy status to other antibiotic agents status: Secondary | ICD-10-CM | POA: Insufficient documentation

## 2019-10-21 DIAGNOSIS — F17210 Nicotine dependence, cigarettes, uncomplicated: Secondary | ICD-10-CM | POA: Insufficient documentation

## 2019-10-21 DIAGNOSIS — X12XXXA Contact with other hot fluids, initial encounter: Secondary | ICD-10-CM | POA: Insufficient documentation

## 2019-10-21 DIAGNOSIS — Y929 Unspecified place or not applicable: Secondary | ICD-10-CM | POA: Insufficient documentation

## 2019-10-21 DIAGNOSIS — Y999 Unspecified external cause status: Secondary | ICD-10-CM | POA: Insufficient documentation

## 2019-10-21 MED ORDER — IBUPROFEN 400 MG PO TABS
600.0000 mg | ORAL_TABLET | Freq: Once | ORAL | Status: AC
  Administered 2019-10-21: 600 mg via ORAL
  Filled 2019-10-21: qty 2

## 2019-10-21 MED ORDER — TETANUS-DIPHTH-ACELL PERTUSSIS 5-2.5-18.5 LF-MCG/0.5 IM SUSP
0.5000 mL | Freq: Once | INTRAMUSCULAR | Status: AC
  Administered 2019-10-21: 0.5 mL via INTRAMUSCULAR
  Filled 2019-10-21: qty 0.5

## 2019-10-21 MED ORDER — SILVER SULFADIAZINE 1 % EX CREA
1.0000 | TOPICAL_CREAM | Freq: Every day | CUTANEOUS | 0 refills | Status: DC
Start: 2019-10-21 — End: 2019-12-31

## 2019-10-21 MED ORDER — SILVER SULFADIAZINE 1 % EX CREA
TOPICAL_CREAM | Freq: Once | CUTANEOUS | Status: AC
  Filled 2019-10-21: qty 50
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
## 2019-10-21 NOTE — Discharge Instructions (Addendum)
Make sure to keep the burned area clean.  Gently wash in the tub or shower every morning.  Reapply Silvadene cream once to twice daily. Follow up with Dr Ulice Bold to make sure your burn heals properly
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
## 2019-12-31 ENCOUNTER — Other Ambulatory Visit: Payer: Self-pay

## 2019-12-31 ENCOUNTER — Encounter (HOSPITAL_COMMUNITY): Payer: Self-pay

## 2019-12-31 ENCOUNTER — Ambulatory Visit (HOSPITAL_COMMUNITY)
Admission: EM | Admit: 2019-12-31 | Discharge: 2019-12-31 | Disposition: A | Discharge status: Home / Self Care | Payer: Self-pay | Attending: Urgent Care | Admitting: Urgent Care

## 2019-12-31 DIAGNOSIS — K219 Gastro-esophageal reflux disease without esophagitis: Secondary | ICD-10-CM | POA: Insufficient documentation

## 2019-12-31 DIAGNOSIS — J069 Acute upper respiratory infection, unspecified: Secondary | ICD-10-CM | POA: Insufficient documentation

## 2019-12-31 DIAGNOSIS — F17200 Nicotine dependence, unspecified, uncomplicated: Secondary | ICD-10-CM

## 2019-12-31 DIAGNOSIS — R41840 Attention and concentration deficit: Secondary | ICD-10-CM | POA: Insufficient documentation

## 2019-12-31 DIAGNOSIS — F17210 Nicotine dependence, cigarettes, uncomplicated: Secondary | ICD-10-CM | POA: Insufficient documentation

## 2019-12-31 DIAGNOSIS — Z20822 Contact with and (suspected) exposure to covid-19: Secondary | ICD-10-CM | POA: Insufficient documentation

## 2019-12-31 MED ORDER — BENZONATATE 100 MG PO CAPS: 100.0000 mg | ORAL_CAPSULE | Freq: Three times a day (TID) | ORAL | 0 refills | Status: DC | PRN

## 2019-12-31 MED ORDER — CETIRIZINE HCL 10 MG PO TABS: 10.0000 mg | ORAL_TABLET | Freq: Every day | ORAL | 0 refills | Status: DC

## 2019-12-31 MED ORDER — PROMETHAZINE-DM 6.25-15 MG/5ML PO SYRP
5.0000 mL | ORAL_SOLUTION | Freq: Every evening | ORAL | 0 refills | Status: DC | PRN
Start: 2019-12-31 — End: 2020-01-31

## 2019-12-31 MED ORDER — ALBUTEROL SULFATE HFA 108 (90 BASE) MCG/ACT IN AERS: 1.0000 | INHALATION_SPRAY | Freq: Four times a day (QID) | RESPIRATORY_TRACT | 0 refills | Status: AC | PRN

## 2019-12-31 NOTE — Discharge Instructions (Signed)

## 2019-12-31 NOTE — ED Triage Notes (Signed)
Pt c/o productive cough w/green mucous, nasal drainage and fatiguex3 days.

## 2019-12-31 NOTE — ED Provider Notes (Signed)
[facility]   MRN: [id number] DOB: 1980/09/03  Subjective:   Ryan Valentine is a 39 y.o. male presenting for COVID 19 sx. Has had 3 day hx of acute onset cough, nasal drainage, fatigue. He is a smoker, has chronic cough and shob. Patient would also like to discuss restarting his medications for ADD/ADHD which he had used as a child.   Denies taking chronic medications.      Past Medical History:  Diagnosis Date  . Back pain   . GERD (gastroesophageal reflux disease)   . Headache(784.0)    occ headaches  . Substance abuse [facility])      Past Surgical History:  Procedure Laterality Date  . BACK SURGERY    . KNEE ARTHROSCOPY    . LUMBAR LAMINECTOMY/DECOMPRESSION MICRODISCECTOMY  06/05/2011   Procedure: LUMBAR LAMINECTOMY/DECOMPRESSION MICRODISCECTOMY;  Surgeon: Temple Pacini, MD;  Location: [facility];  Service: Neurosurgery;  Laterality: Right;  Right Lumbar five-Sacral one laminectomy/microdiscectomy    Family History  Problem Relation Age of Onset  . Diabetes Mother   . Hypertension Mother   . Diabetes Father     Social History   Tobacco Use  . Smoking status: Current Every Day Smoker    Packs/day: 1.00    Types: Cigarettes  . Smokeless tobacco: Never Used  Vaping Use  . Vaping Use: Never used  Substance Use Topics  . Alcohol use: Yes    Comment: socially  . Drug use: Not Currently    Comment: denies use 10/20/18    ROS   Objective:   Vitals: BP 134/86   Pulse 66   Temp 97.9 F (36.6 C) (Oral)   Resp 16   Ht 6\' 4"  (1.93 m)   Wt 200 lb (90.7 kg)   SpO2 99%   BMI 24.34 kg/m   Physical Exam Constitutional:      General: He is not in acute distress.    Appearance: Normal appearance. He is well-developed. He is not ill-appearing, toxic-appearing or diaphoretic.  HENT:     Head: Normocephalic and atraumatic.     Right Ear: External ear normal.     Left Ear: External ear normal.     Nose: Nose normal.     Mouth/Throat:     Mouth: Mucous  membranes are moist.     Pharynx: Oropharynx is clear.  Eyes:     General: No scleral icterus.    Extraocular Movements: Extraocular movements intact.     Pupils: Pupils are equal, round, and reactive to light.  Cardiovascular:     Rate and Rhythm: Normal rate and regular rhythm.     Heart sounds: Normal heart sounds. No murmur heard.  No friction rub. No gallop.   Pulmonary:     Effort: Pulmonary effort is normal. No respiratory distress.     Breath sounds: Normal breath sounds. No stridor. No wheezing, rhonchi or rales.  Neurological:     Mental Status: He is alert and oriented to person, place, and time.  Psychiatric:        Mood and Affect: Mood normal.        Behavior: Behavior normal.        Thought Content: Thought content normal.        Judgment: Judgment normal.      Assessment and Plan :   PDMP not reviewed this encounter.  1. Viral URI with cough   2. Smoker   3. Attention deficit     Will manage for  viral illness such as viral URI, viral syndrome, viral rhinitis, COVID-19. Counseled patient on nature of COVID-19 including modes of transmission, diagnostic testing, management and supportive care.  Offered scripts for symptomatic relief. COVID 19 testing is pending. Redirected to [facility] for diagnosis and management of his ADHD. Counseled patient on potential for adverse effects with medications prescribed/recommended today, ER and return-to-clinic precautions discussed, patient verbalized understanding.     Wallis Bamberg, PA-C 01/01/20 1022

## 2020-01-01 LAB — SARS CORONAVIRUS 2 (TAT 6-24 HRS): SARS Coronavirus 2: NEGATIVE

## 2020-01-29 ENCOUNTER — Other Ambulatory Visit: Payer: Self-pay | Admitting: Family Medicine

## 2020-01-29 ENCOUNTER — Other Ambulatory Visit (HOSPITAL_COMMUNITY): Payer: Self-pay | Admitting: Family Medicine

## 2020-01-29 DIAGNOSIS — M5441 Lumbago with sciatica, right side: Secondary | ICD-10-CM

## 2020-01-31 ENCOUNTER — Ambulatory Visit (HOSPITAL_COMMUNITY)
Admission: EM | Admit: 2020-01-31 | Discharge: 2020-01-31 | Disposition: A | Discharge status: Home / Self Care | Payer: No Payment, Other | Attending: Psychiatry | Admitting: Psychiatry

## 2020-01-31 ENCOUNTER — Other Ambulatory Visit: Payer: Self-pay

## 2020-01-31 DIAGNOSIS — F329 Major depressive disorder, single episode, unspecified: Secondary | ICD-10-CM | POA: Diagnosis not present

## 2020-01-31 DIAGNOSIS — F1114 Opioid abuse with opioid-induced mood disorder: Secondary | ICD-10-CM

## 2020-01-31 DIAGNOSIS — R450 Nervousness: Secondary | ICD-10-CM | POA: Diagnosis not present

## 2020-01-31 DIAGNOSIS — F1190 Opioid use, unspecified, uncomplicated: Secondary | ICD-10-CM | POA: Insufficient documentation

## 2020-01-31 NOTE — ED Provider Notes (Signed)
[facility] Medical Screening Exam  Patient Name: Ryan Valentine MRN: [id number] Date of Evaluation: 01/31/20 Chief Complaint:   Diagnosis:  Final diagnoses:  None    History of Present illness: Ryan Valentine is a 39 y.o. male presents to [facility] requesting resources for detox facilities from opiate.  Reported he has not used opiates in the past 2 days, however has been using for the past 20 + years. Stated he has been in a rehabilitation facility in the past and is willing to seek additional services at this time. denies suicidal or homicidal ideations.  Denies auditory or visual hallucinations.  Denied previous inpatient admissions.  Denied it was followed by therapy and/or psychiatry currently.  Patient reports depression, mood lability, racing thoughts and insomnia.  TTS to seek residential treatment facility.  Support, reassurance and encouragement was provided.  Psychiatric Specialty Exam  Presentation  General Appearance:Appropriate for Environment  Eye Contact:Good  Speech:Clear and Coherent  Speech Volume:Normal  Handedness:Right   Mood and Affect  Mood:Anxious;Depressed  Affect:Appropriate   Thought Process  Thought Processes:Coherent  Descriptions of Associations:Intact  Orientation:Full (Time, Place and Person)  Thought Content:Logical  Hallucinations:None  Ideas of Reference:None  Suicidal Thoughts:No  Homicidal Thoughts:No   Sensorium  Memory:Immediate Good  Judgment:Good  Insight:Good   Executive Functions  Concentration:Fair  Attention Span:Fair  Recall:Good  Fund of Knowledge:Fair  Language:Good   Psychomotor Activity  Psychomotor Activity:Normal   Assets  Assets:Physical Health;Intimacy;Financial Resources/Insurance;Desire for Improvement;Social Support   Sleep  Sleep:Poor  Number of hours: 5   Physical Exam: Physical Exam Vitals reviewed.  Neurological:      Mental Status: He is alert.  Psychiatric:        Mood and Affect: Mood normal.    Review of Systems  Neurological: Positive for headaches.  Psychiatric/Behavioral: Positive for depression. Negative for suicidal ideas. The patient is nervous/anxious and has insomnia.   All other systems reviewed and are negative.  Blood pressure (!) 119/94, pulse 90, temperature 98.2 F (36.8 C), temperature source Tympanic, SpO2 100 %. There is no height or weight on file to calculate BMI.  Musculoskeletal: Strength & Muscle Tone: within normal limits Gait & Station: normal Patient leans: N/A   [facility] MSE Discharge Disposition for Follow up and Recommendations: Based on my evaluation the patient does not appear to have an emergency medical condition and can be discharged with resources and follow up care in outpatient services for Substance Abuse Intensive Outpatient Program - TTS to follow up with [facility]) for bed availability  Oneta Rack, NP 01/31/2020, 6:08 PM

## 2020-01-31 NOTE — BH Assessment (Signed)
Comprehensive Clinical Assessment (CCA) Note  01/31/2020 [facility] [id number]   Patient presents to the [facility] seeking help for his opioid problem.  Patient states that he cannot return home without help.  He states that he has lost his wife and kids over his addiction.  Patient states that he has been abusing opioids since he was 39 years old, but states that he has not used in the past two days. Patient states that he used 50-100 pills in two days.  Patient denies SI/HI/Psychosis.  He states that he has never tried to hurt himself or others in the past and he states that he has never been in an inpatient psychiatric unit of [facility] facility in the past.  Patient states that he has not been sleeping well or eating well over the past couple days. Patient denies any history of abuse or self mutilation.  Patient is agreeable to going to a detox [facility] tonight, but TTS was unable to find any beds availalble tonight.  [facility] in [facility] has two discharges in the morning and are agreeable to assess patient to see if he is appropriate for admission.  Patient's family may be willing to assist him with transportation there.  TTS spoke to patient's wife, [facility], who was going to try to facilitate transportation for him to get there tomorrow  Visit Diagnosis:      ICD-10-CM   1. 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 Legal System  Referral name: [facility]  Referral phone number: No data recorded  Whom do you see for routine medical problems? I don't have a doctor  Practice/Facility Name: No data recorded Practice/Facility Phone Number: No data recorded Name of Contact: No data recorded Contact Number: No data recorded Contact Fax Number: No data recorded Prescriber Name: No data recorded Prescriber Address (if known): No data recorded  What Is the Reason for  Your Visit/Call Today? Patient is seeking detox [facility] for his opioid dependency issues  How Long Has This Been Causing You Problems? > than 6 months  What Do You Feel Would Help You the Most Today? Other (Comment) (detox Valentine)   Have You Recently Been in Any Inpatient Treatment (Valentine/Detox/Crisis Valentine/28-Day Program)? No  Name/Location of Program/[facility]:No data recorded How Long Were You There? No data recorded When Were You Discharged? No data recorded  Have You Ever Received [facility] From [facility] Before? No  Who Do You See at [facility]? No data recorded  Have You Recently Had Any Thoughts About Hurting Yourself? No  Are You Planning to Commit Suicide/Harm Yourself At This time? No   Have you Recently Had Thoughts About Hurting Someone Karolee Ohs? No  Explanation: No data recorded  Have You Used Any Alcohol or Drugs in the Past 24 Hours? Yes  How Long Ago Did You Use Drugs or Alcohol? No data recorded What Did You Use and How Much? unable   Do You Currently Have a Therapist/Psychiatrist? No  Name of Therapist/Psychiatrist: No data recorded  Have You Been Recently Discharged From Any Office Practice or Programs? No  Explanation of Discharge From Practice/Program: No data recorded    CCA Screening Triage Referral Assessment Type of Contact: Face-to-Face  Is this Initial or Reassessment? No data recorded Date Telepsych consult ordered in [facility]:  No data recorded Time Telepsych consult ordered in [facility]:  No data  recorded  Patient Reported Information Reviewed? Yes  Patient Left Without Being Seen? No data recorded Reason for Not Completing Assessment: No data recorded  Collateral Involvement: none available   Does Patient Have a Court Appointed Legal Guardian? No data recorded Name and Contact of Legal Guardian: No data recorded If Minor and Not Living with Parent(s), Who has Custody? No data recorded Is CPS involved or ever been involved?  Never  Is APS involved or ever been involved? Never   Patient Determined To Be At Risk for Harm To Self or Others Based on Review of Patient Reported Information or Presenting Complaint? No  Method: No data recorded Availability of Means: No data recorded Intent: No data recorded Notification Required: No data recorded Additional Information for Danger to Others Potential: No data recorded Additional Comments for Danger to Others Potential: No data recorded Are There Guns or Other Weapons in Your Home? No data recorded Types of Guns/Weapons: No data recorded Are These Weapons Safely Secured?                            No data recorded Who Could Verify You Are Able To Have These Secured: No data recorded Do You Have any Outstanding Charges, Pending Court Dates, Parole/Probation? No data recorded Contacted To Inform of Risk of Harm To Self or Others: No data recorded  Location of Assessment: [facility]   Does Patient Present under Involuntary Commitment? No  IVC Papers Initial File Date: No data recorded  [place] of Residence: [place]   Patient Currently Receiving the Following Valentine: Not Receiving Valentine   Determination of Need: Emergent (2 hours)   Options For Referral: No data recorded    CCA Biopsychosocial  Intake/Chief Complaint:  CCA Intake With Chief Complaint CCA Part Two Date: 01/31/20 CCA Part Two Time: 1844 Chief Complaint/Presenting Problem: Patient states that he has been abusing opioid drugs and states that he is tired of living this way and states that he would like to go to detox.  Patient states, "I am about to lose my wife and family because of my addiction." Patient's Currently Reported Symptoms/Problems: Patient states that he last used two days ago and states that he is in opioid withdrawal. Individual's Strengths: not assessed Individual's Preferences: patient has no preferences that require accommodation Individual's  Abilities: Not assessed Type of Valentine Patient Feels Are Needed: Inpatient detoxification  Mental Health Symptoms Depression:  Depression: None  Mania:  Mania: None  Anxiety:   Anxiety: Restlessness, Sleep  Psychosis:  Psychosis: None  Trauma:  Trauma: None  Obsessions:  Obsessions: None  Compulsions:  Compulsions: None  Inattention:  Inattention: None  Hyperactivity/Impulsivity:  Hyperactivity/Impulsivity: N/A  Oppositional/Defiant Behaviors:  Oppositional/Defiant Behaviors: None  Emotional Irregularity:  Emotional Irregularity: None  Other Mood/Personality Symptoms:      Mental Status Exam Appearance and self-care  Stature:  Stature: Average  Weight:  Weight: Thin  Clothing:  Clothing: Casual  Grooming:  Grooming: Normal  Cosmetic use:  Cosmetic Use: None  Posture/gait:  Posture/Gait: Normal  Motor activity:  Motor Activity: Restless  Sensorium  Attention:  Attention: Normal  Concentration:  Concentration: Normal  Orientation:  Orientation: Object, Person, Place, Situation, Time  Recall/memory:  Recall/Memory: Normal  Affect and Mood  Affect:  Affect: Depressed  Mood:  Mood: Depressed  Relating  Eye contact:  Eye Contact: None  Facial expression:  Facial Expression: Depressed  Attitude toward examiner:  Attitude Toward Examiner:  Cooperative  Thought and Language  Speech flow: Speech Flow: Normal  Thought content:  Thought Content: Appropriate to Mood and Circumstances  Preoccupation:  Preoccupations: None  Hallucinations:  Hallucinations: None  Organization:     Company secretaryxecutive Functions  Fund of Knowledge:  Fund of Knowledge: Good  Intelligence:  Intelligence: Average  Abstraction:  Abstraction: Normal  Judgement:  Judgement: Fair  Dance movement psychotherapisteality Testing:  Reality Testing: Realistic  Insight:  Insight: Fair  Decision Making:  Decision Making: Impulsive  Social Functioning  Social Maturity:  Social Maturity: Isolates  Social Judgement:  Social Judgement: "Chief of Stafftreet Smart"   Stress  Stressors:  Stressors: Family conflict, Surveyor, quantityinancial  Coping Ability:  Coping Ability: Horticulturist, commercialxhausted  Skill Deficits:  Skill Deficits: Scientist, physiologicalDecision making  Supports:  Supports: Support needed     Religion: Religion/Spirituality Are You A Religious Person?:  (not assessed)  Leisure/Recreation: Leisure / Recreation Do You Have Hobbies?: No  Exercise/Diet: Exercise/Diet Do You Exercise?: No Have You Gained or Lost A Significant Amount of Weight in the Past Six Months?: No Do You Follow a Special Diet?: No Do You Have Any Trouble Sleeping?: Yes Explanation of Sleeping Difficulties: no sleep in past couple days   CCA Employment/Education  Employment/Work Situation: Employment / Work Situation Employment situation: Unemployed Patient's job has been impacted by current illness: Yes Describe how patient's job has been impacted: unable to maintain employment due to his addition What is the longest time patient has a held a job?: unknown Where was the patient employed at that time?: unknown Has patient ever been in the [employer] and Companymilitary?: No  Education: Education Is Patient Currently Attending School?: No Last Grade Completed: 12 Name of High School: not assessed Did Garment/textile technologistYou Graduate From [employer] School?: Yes Did Theme Valentine managerYou Attend College?: Yes What Type of College Degree Do you Have?: [employer], 2 year degree in business Did [place] Attend Graduate School?: No What Was Your Major?: Business Did You Have An Individualized Education Program (IIEP): No Did You Have Any Difficulty At School?: No Patient's Education Has Been Impacted by Current Illness: No   CCA Family/Childhood History  Family and Relationship History: Family history Marital status: Married Number of Years Married:  (not assessed) What types of issues is patient dealing with in the relationship?: wife threatening to leave due to his addiction issues Are you sexually active?: Yes What is your sexual orientation?: heterosexual Has your  sexual activity been affected by drugs, alcohol, medication, or emotional stress?: decreased sex drive Does patient have children?: Yes How many children?: 3 How is patient's relationship with their children?: strained relationships due to his drug use  Childhood History:  Childhood History By whom was/is the patient raised?: Both parents Description of patient's relationship with caregiver when they were a child: patient states that he was close to his parents growing up Patient's description of current relationship with people who raised him/her: Patient states that he is still close to his parents How were you disciplined when you got in trouble as a child/adolescent?: Patient states that he was disciplined appropriately Does patient have siblings?: Yes Number of Siblings: 1 Description of patient's current relationship with siblings: Patient states that he is close to his sister Did patient suffer any verbal/emotional/physical/sexual abuse as a child?: No Did patient suffer from severe childhood neglect?: No Has patient ever been sexually abused/assaulted/raped as an adolescent or adult?: No Was the patient ever a victim of a crime or a disaster?: No Witnessed domestic violence?: No Has patient been affected by domestic violence as  an adult?: No  Child/Adolescent Assessment:     CCA Substance Use  Alcohol/Drug Use: Alcohol / Drug Use Pain Medications: see MAR Prescriptions: see MAR Over the Counter: see MAR History of alcohol / drug use?: Yes Longest period of sobriety (when/how long): unknown Substance #1 Name of Substance 1: opioids 1 - Age of First Use: 19 1 - Amount (size/oz): unknown amount,  took 50-100 pills in 2 days 1 - Frequency: daily 1 - Duration: unknown 1 - Last Use / Amount: unknown amount,  took 50-100 pills in 2 days                       ASAM's:  Six Dimensions of Multidimensional Assessment  Dimension 1:  Acute Intoxication and/or  Withdrawal Potential:   Dimension 1:  Description of individual's past and current experiences of substance use and withdrawal: Patient is starting to experience body aches due to his withdrawal symptoms  Dimension 2:  Biomedical Conditions and Complications:   Dimension 2:  Description of patient's biomedical conditions and  complications: Patient has no medical issues that are exacerbated by his drug use  Dimension 3:  Emotional, Behavioral, or Cognitive Conditions and Complications:  Dimension 3:  Description of emotional, behavioral, or cognitive conditions and complications: Patient denies any current mental health issues  Dimension 4:  Readiness to Change:  Dimension 4:  Description of Readiness to Change criteria: Patient states that he is ready to stop using because his wife is going to leave him due to his addiction  Dimension 5:  Relapse, Continued use, or Continued Problem Potential:  Dimension 5:  Relapse, continued use, or continued problem potential critiera description: Patient is a chronic relapser  Dimension 6:  Recovery/Living Environment:  Dimension 6:  Recovery/Iiving environment criteria description: Patient was kicked out of his home tonight due to his addiction and he has nowhere to go  ASAM Severity Score: ASAM's Severity Rating Score: 9  ASAM Recommended Level of Treatment: ASAM Recommended Level of Treatment: Level III Residential Treatment   Substance use Disorder (SUD) Substance Use Disorder (SUD)  Checklist Symptoms of Substance Use: Continued use despite having a persistent/recurrent physical/psychological problem caused/exacerbated by use, Continued use despite persistent or recurrent social, interpersonal problems, caused or exacerbated by use, Evidence of tolerance, Evidence of withdrawal (Comment), Large amounts of time spent to obtain, use or recover from the substance(s), Persistent desire or unsuccessful efforts to cut down or control use, Presence of craving or  strong urge to use, Recurrent use that results in a failure to fulfill major role obligations (work, school, home), Social, occupational, recreational activities given up or reduced due to use, Substance(s) often taken in larger amounts or over longer times than was intended  Recommendations for Valentine/Supports/Treatments: Recommendations for Valentine/Supports/Treatments Recommendations For Valentine/Supports/Treatments: Residential-Level 3  DSM5 Diagnoses: Patient Active Problem List   Diagnosis Date Noted  . Opioid use disorder (HCC)   . Polysubstance abuse (HCC) 11/21/2013  . Acute encephalopathy 11/21/2013  . Acute respiratory failure (HCC) 11/21/2013  . Opiate overdose (HCC) 11/20/2013  . Intentional opiate overdose (HCC) 11/20/2013  . Facial cellulitis 02/12/2012  . Tobacco abuse 02/12/2012  . Lumbar herniated disc 06/05/2011    Disposition: Per Hillery Jacks, NP, patient does not meet inpatient admission criteria and can follow-up with [facility] in [place]  tommorrow   Referrals to Alternative Service(s): Referred to Alternative Service(s):   Place:   Date:   Time:    Referred to Alternative Service(s):  Place:   Date:   Time:    Referred to Alternative Service(s):   Place:   Date:   Time:    Referred to Alternative Service(s):   Place:   Date:   Time:     Shakendra Griffeth J Osamah Schmader

## 2020-01-31 NOTE — ED Notes (Signed)
Patient belongings in locker 11 

## 2020-01-31 NOTE — Discharge Instructions (Signed)
Take all medications as prescribed. Keep all follow-up appointments as scheduled.  Do not consume alcohol or use illegal drugs while on prescription medications. Report any adverse effects from your medications to your primary care provider promptly.  In the event of recurrent symptoms or worsening symptoms, call 911, a crisis hotline, or go to the nearest emergency department for evaluation.   

## 2020-02-03 ENCOUNTER — Ambulatory Visit (HOSPITAL_COMMUNITY): Admission: RE | Admit: 2020-02-03 | Payer: Self-pay | Source: Ambulatory Visit

## 2020-02-03 ENCOUNTER — Encounter (HOSPITAL_COMMUNITY): Payer: Self-pay

## 2022-03-09 ENCOUNTER — Emergency Department (HOSPITAL_COMMUNITY)
Admission: EM | Admit: 2022-03-09 | Discharge: 2022-03-09 | Disposition: A | Discharge status: Home / Self Care | Payer: Self-pay | Attending: Emergency Medicine | Admitting: Emergency Medicine

## 2022-03-09 ENCOUNTER — Emergency Department (HOSPITAL_COMMUNITY): Payer: Self-pay

## 2022-03-09 ENCOUNTER — Encounter (HOSPITAL_COMMUNITY): Payer: Self-pay | Admitting: Emergency Medicine

## 2022-03-09 ENCOUNTER — Other Ambulatory Visit: Payer: Self-pay

## 2022-03-09 DIAGNOSIS — S92354A Nondisplaced fracture of fifth metatarsal bone, right foot, initial encounter for closed fracture: Secondary | ICD-10-CM | POA: Insufficient documentation

## 2022-03-09 DIAGNOSIS — S93401A Sprain of unspecified ligament of right ankle, initial encounter: Secondary | ICD-10-CM

## 2022-03-09 DIAGNOSIS — X501XXA Overexertion from prolonged static or awkward postures, initial encounter: Secondary | ICD-10-CM | POA: Insufficient documentation

## 2022-03-09 MED ORDER — IBUPROFEN 800 MG PO TABS: 800.0000 mg | ORAL_TABLET | Freq: Three times a day (TID) | ORAL | 0 refills | Status: AC | PRN

## 2022-03-09 NOTE — ED Triage Notes (Signed)
Patient c/o right foot/ankle pain after stepping off the back of a truck and hearing a "snap."  Patient ambulatory with slight limp into triage. Patient endorses swelling to right ankle.

## 2022-03-09 NOTE — ED Provider Notes (Signed)
[facility] EMERGENCY DEPARTMENT Provider Note   CSN: [id number] Arrival date & time: 03/09/22  1318     History  Chief Complaint  Patient presents with   Ankle Pain    Ryan Valentine is a 41 y.o. male.  Patient has a history of GERD and substance abuse.  He turned his ankle today  The history is provided by the patient and medical records. No language interpreter was used.  Ankle Pain Lower extremity pain location: Right ankle. Injury: yes   Pain details:    Quality:  Aching   Radiates to:  Does not radiate   Severity:  Moderate   Onset quality:  Sudden   Timing:  Constant   Progression:  Worsening Chronicity:  New Associated symptoms: no back pain and no fatigue        Home Medications Prior to Admission medications   Medication Sig Start Date End Date Taking? Authorizing Provider  ibuprofen (ADVIL) 800 MG tablet Take 1 tablet (800 mg total) by mouth every 8 (eight) hours as needed. 03/09/22  Yes Bethann Berkshire, MD  albuterol (VENTOLIN HFA) 108 (90 Base) MCG/ACT inhaler Inhale 1-2 puffs into the lungs every 6 (six) hours as needed for wheezing or shortness of breath. 12/31/19   Wallis Bamberg, PA-C  dicyclomine (BENTYL) 20 MG tablet Take 1 tablet (20 mg total) by mouth 2 (two) times daily. 01/08/17 12/31/19  Gilda Crease, MD  pantoprazole (PROTONIX) 20 MG tablet Take 1 tablet (20 mg total) by mouth 2 (two) times daily. 01/08/17 12/31/19  Gilda Crease, MD  sucralfate (CARAFATE) 1 GM/10ML suspension Take 10 mLs (1 g total) by mouth 4 (four) times daily -  with meals and at bedtime. 01/08/17 12/31/19  Gilda Crease, MD      Allergies    Ms contin [morphine sulfate], Penicillins, Tramadol, Erythrocin, and Ketorolac tromethamine    Review of Systems   Review of Systems  Constitutional:  Negative for appetite change and fatigue.  HENT:  Negative for congestion, ear discharge and sinus pressure.   Eyes:  Negative for discharge.  Respiratory:   Negative for cough.   Cardiovascular:  Negative for chest pain.  Gastrointestinal:  Negative for abdominal pain and diarrhea.  Genitourinary:  Negative for frequency and hematuria.  Musculoskeletal:  Negative for back pain.       Right ankle and foot  Skin:  Negative for rash.  Neurological:  Negative for seizures and headaches.  Psychiatric/Behavioral:  Negative for hallucinations.     Physical Exam Updated Vital Signs BP (!) 128/90 (BP Location: Right Arm)   Pulse 80   Temp 98.2 F (36.8 C) (Oral)   Resp 14   Ht 6\' 5"  (1.956 m)   Wt 99.8 kg   SpO2 98%   BMI 26.09 kg/m  Physical Exam Vitals and nursing note reviewed.  Constitutional:      Appearance: He is well-developed.  HENT:     Head: Normocephalic.     Nose: Nose normal.  Eyes:     General: No scleral icterus.    Conjunctiva/sclera: Conjunctivae normal.  Neck:     Thyroid: No thyromegaly.  Cardiovascular:     Rate and Rhythm: Normal rate and regular rhythm.     Heart sounds: No murmur heard.    No friction rub. No gallop.  Pulmonary:     Breath sounds: No stridor. No wheezing or rales.  Chest:     Chest wall: No tenderness.  Abdominal:  General: There is no distension.     Tenderness: There is no abdominal tenderness. There is no rebound.  Musculoskeletal:     Cervical back: Neck supple.     Comments: Tenderness to right ankle and foot  Lymphadenopathy:     Cervical: No cervical adenopathy.  Skin:    Findings: No erythema or rash.  Neurological:     Mental Status: He is alert and oriented to person, place, and time.     Motor: No abnormal muscle tone.     Coordination: Coordination normal.  Psychiatric:        Behavior: Behavior normal.     ED Results / Procedures / Treatments   Labs (all labs ordered are listed, but only abnormal results are displayed) Labs Reviewed - No data to display  EKG None  Radiology No results found.  Procedures Procedures    Medications Ordered in  ED Medications - No data to display  ED Course/ Medical Decision Making/ A&P                           Medical Decision Making Amount and/or Complexity of Data Reviewed Radiology: ordered.  Risk Prescription drug management.   Patient with a sprained right ankle and fracture of the fifth metatarsal.  He is placed in a ASO and given crutches to ambulate with.  Patient will give Motrin for pain and follow-up with Ortho        Final Clinical Impression(s) / ED Diagnoses Final diagnoses:  Sprain of right ankle, unspecified ligament, initial encounter    Rx / DC Orders ED Discharge Orders          Ordered    ibuprofen (ADVIL) 800 MG tablet  Every 8 hours PRN        03/09/22 1455              Milton Ferguson, MD 03/14/22 1111

## 2022-03-09 NOTE — Discharge Instructions (Signed)
Use the crutches to ambulate with.  Do not put weight on your left foot.  Follow-up with Dr. Amedeo Kinsman next week.  Keep your foot elevated for the next couple days.
